# Patient Record
Sex: Female | Born: 1970 | Race: Black or African American | Hispanic: No | Marital: Single | Smoking: Current every day smoker
Health system: Southern US, Community
[De-identification: ages and names within clinical notes are randomized; demographics above are authoritative.]

---

## 1999-02-06 ENCOUNTER — Other Ambulatory Visit: Admission: RE | Admit: 1999-02-06 | Discharge: 1999-02-06 | Payer: Self-pay | Admitting: Obstetrics

## 2000-03-14 ENCOUNTER — Other Ambulatory Visit: Admission: RE | Admit: 2000-03-14 | Discharge: 2000-03-14 | Payer: Self-pay | Admitting: Obstetrics & Gynecology

## 2001-02-13 ENCOUNTER — Emergency Department (HOSPITAL_COMMUNITY): Admission: EM | Admit: 2001-02-13 | Discharge: 2001-02-13 | Payer: Self-pay | Admitting: Emergency Medicine

## 2001-11-19 ENCOUNTER — Other Ambulatory Visit: Admission: RE | Admit: 2001-11-19 | Discharge: 2001-11-19 | Payer: Self-pay | Admitting: Obstetrics and Gynecology

## 2002-05-14 ENCOUNTER — Encounter: Admission: RE | Admit: 2002-05-14 | Discharge: 2002-05-14 | Payer: Self-pay | Admitting: Family Medicine

## 2002-05-21 ENCOUNTER — Encounter: Admission: RE | Admit: 2002-05-21 | Discharge: 2002-05-21 | Payer: Self-pay | Admitting: Sports Medicine

## 2002-05-21 ENCOUNTER — Encounter: Payer: Self-pay | Admitting: Sports Medicine

## 2002-06-12 ENCOUNTER — Encounter: Admission: RE | Admit: 2002-06-12 | Discharge: 2002-06-12 | Payer: Self-pay | Admitting: Family Medicine

## 2002-06-15 ENCOUNTER — Encounter: Admission: RE | Admit: 2002-06-15 | Discharge: 2002-06-15 | Payer: Self-pay | Admitting: Family Medicine

## 2002-07-28 ENCOUNTER — Encounter: Admission: RE | Admit: 2002-07-28 | Discharge: 2002-07-28 | Payer: Self-pay | Admitting: Family Medicine

## 2002-08-11 ENCOUNTER — Encounter: Admission: RE | Admit: 2002-08-11 | Discharge: 2002-08-11 | Payer: Self-pay | Admitting: Family Medicine

## 2003-01-05 ENCOUNTER — Encounter: Admission: RE | Admit: 2003-01-05 | Discharge: 2003-01-05 | Payer: Self-pay | Admitting: Obstetrics and Gynecology

## 2003-01-05 ENCOUNTER — Encounter: Payer: Self-pay | Admitting: Obstetrics and Gynecology

## 2003-03-30 ENCOUNTER — Other Ambulatory Visit: Admission: RE | Admit: 2003-03-30 | Discharge: 2003-03-30 | Payer: Self-pay | Admitting: Obstetrics and Gynecology

## 2003-04-27 ENCOUNTER — Encounter: Admission: RE | Admit: 2003-04-27 | Discharge: 2003-04-27 | Payer: Self-pay | Admitting: Family Medicine

## 2003-04-28 ENCOUNTER — Encounter: Admission: RE | Admit: 2003-04-28 | Discharge: 2003-04-28 | Payer: Self-pay | Admitting: Family Medicine

## 2003-08-03 ENCOUNTER — Encounter: Admission: RE | Admit: 2003-08-03 | Discharge: 2003-08-03 | Payer: Self-pay | Admitting: Family Medicine

## 2003-10-19 ENCOUNTER — Encounter: Admission: RE | Admit: 2003-10-19 | Discharge: 2003-10-19 | Payer: Self-pay | Admitting: Family Medicine

## 2004-04-23 ENCOUNTER — Encounter (INDEPENDENT_AMBULATORY_CARE_PROVIDER_SITE_OTHER): Payer: Self-pay | Admitting: *Deleted

## 2004-04-25 ENCOUNTER — Encounter: Admission: RE | Admit: 2004-04-25 | Discharge: 2004-04-25 | Payer: Self-pay | Admitting: Family Medicine

## 2004-04-25 ENCOUNTER — Encounter: Payer: Self-pay | Admitting: Family Medicine

## 2004-07-25 ENCOUNTER — Encounter: Admission: RE | Admit: 2004-07-25 | Discharge: 2004-07-25 | Payer: Self-pay | Admitting: Sports Medicine

## 2004-10-20 ENCOUNTER — Other Ambulatory Visit: Admission: RE | Admit: 2004-10-20 | Discharge: 2004-10-20 | Payer: Self-pay | Admitting: Obstetrics and Gynecology

## 2005-02-13 ENCOUNTER — Ambulatory Visit: Payer: Self-pay | Admitting: Family Medicine

## 2005-04-05 ENCOUNTER — Observation Stay (HOSPITAL_COMMUNITY): Admission: EM | Admit: 2005-04-05 | Discharge: 2005-04-05 | Payer: Self-pay | Admitting: Emergency Medicine

## 2005-05-02 IMAGING — CR DG ANKLE COMPLETE 3+V*R*
2 series · 2 of 2 positions shown · non-contrast
Comparison: none

CLINICAL DATA: Trimalleolar fracture

RIGHT ANKLE - 2 VIEW

[view not recorded (1 of 2)]
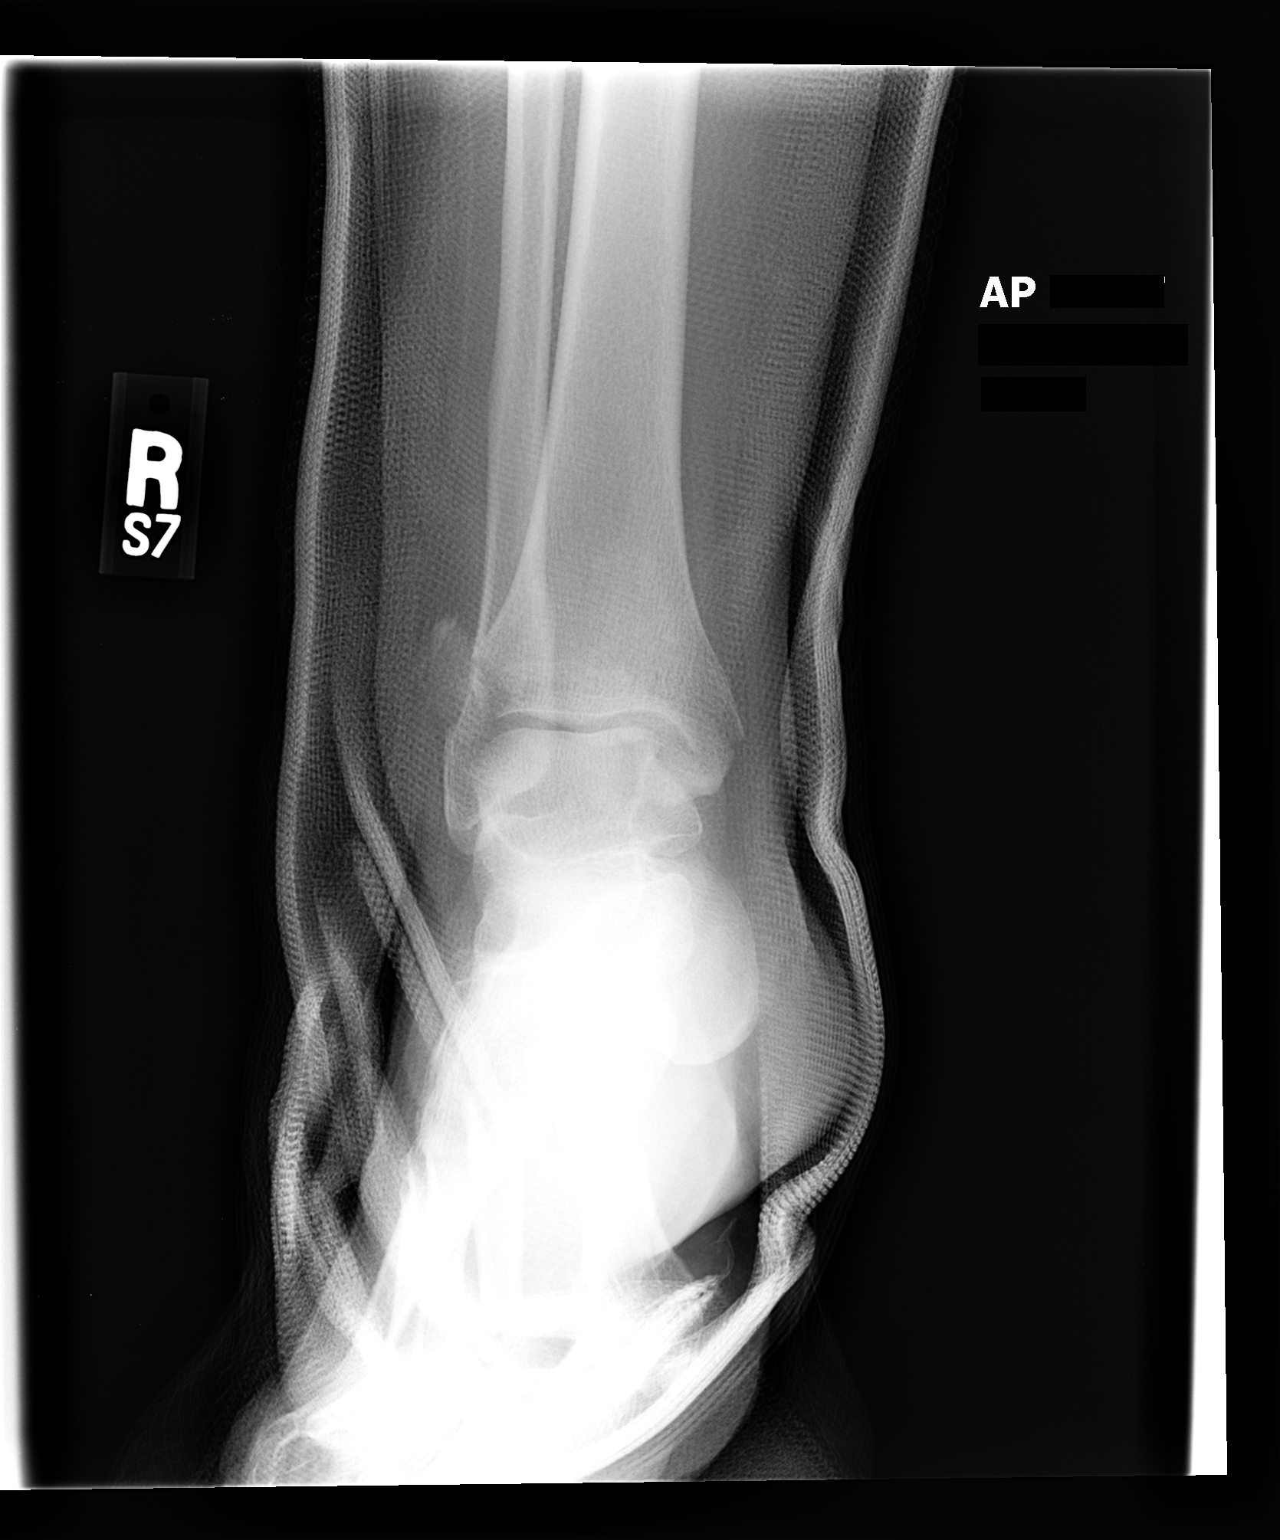

[view not recorded (2 of 2)]
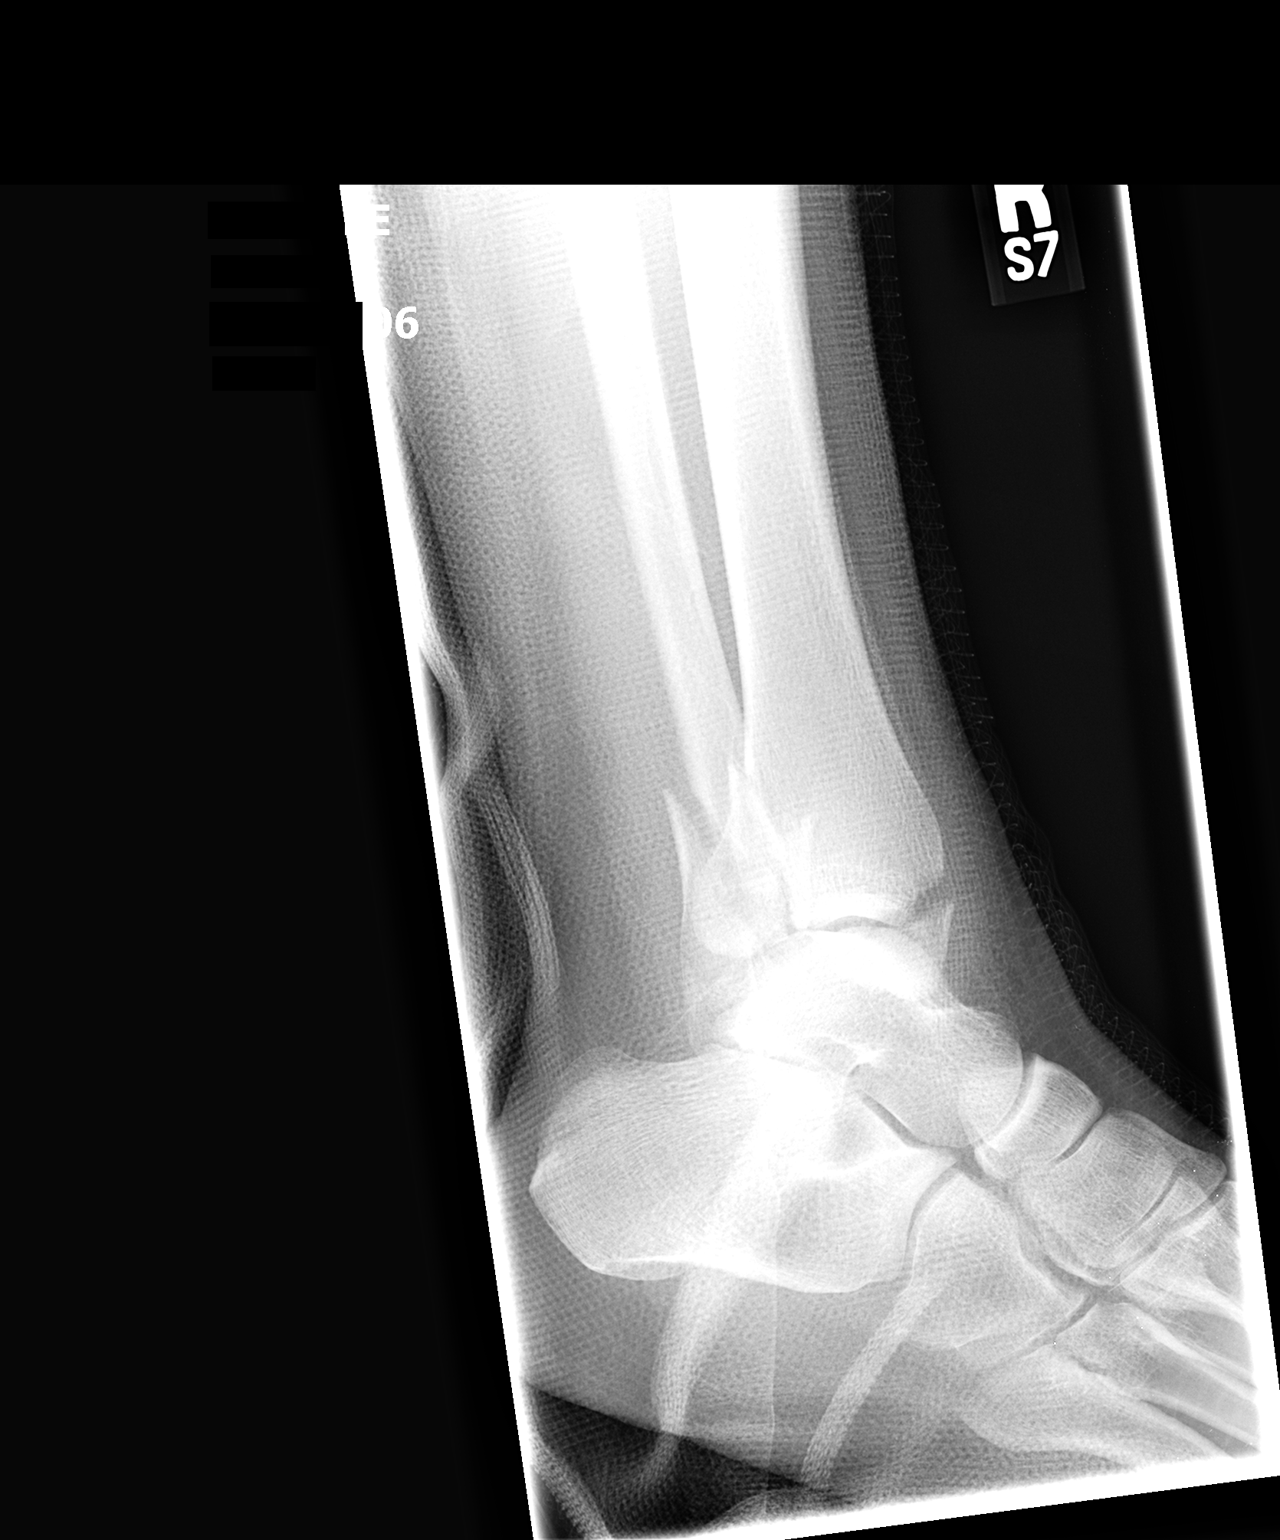

[2 of 2 positions shown; findings below may reference images not displayed]

FINDINGS: In plaster views of the right ankle show that the dislocation has
been reduced. The trimalleolar fracture remains moderately displaced. There is
continued mild widening of the ankle mortise medially.

IMPRESSION

Interval reduction of the right ankle dislocation. The trimalleolar fracture
fragments remain moderately displaced.

## 2005-07-11 ENCOUNTER — Other Ambulatory Visit: Admission: RE | Admit: 2005-07-11 | Discharge: 2005-07-11 | Payer: Self-pay | Admitting: Gynecology

## 2005-09-17 ENCOUNTER — Ambulatory Visit: Payer: Self-pay | Admitting: Family Medicine

## 2005-10-18 ENCOUNTER — Other Ambulatory Visit: Admission: RE | Admit: 2005-10-18 | Discharge: 2005-10-18 | Payer: Self-pay | Admitting: Gynecology

## 2005-12-13 ENCOUNTER — Ambulatory Visit: Payer: Self-pay | Admitting: Family Medicine

## 2006-02-18 ENCOUNTER — Ambulatory Visit: Payer: Self-pay | Admitting: Family Medicine

## 2006-04-22 ENCOUNTER — Ambulatory Visit: Payer: Self-pay | Admitting: Family Medicine

## 2006-09-09 ENCOUNTER — Encounter: Admission: RE | Admit: 2006-09-09 | Discharge: 2006-09-09 | Payer: Self-pay | Admitting: Gastroenterology

## 2006-10-24 ENCOUNTER — Ambulatory Visit: Payer: Self-pay | Admitting: Sports Medicine

## 2007-01-21 ENCOUNTER — Other Ambulatory Visit: Admission: RE | Admit: 2007-01-21 | Discharge: 2007-01-21 | Payer: Self-pay | Admitting: Gynecology

## 2007-02-21 ENCOUNTER — Encounter (INDEPENDENT_AMBULATORY_CARE_PROVIDER_SITE_OTHER): Payer: Self-pay | Admitting: *Deleted

## 2007-03-04 ENCOUNTER — Telehealth: Payer: Self-pay | Admitting: *Deleted

## 2007-03-04 ENCOUNTER — Ambulatory Visit: Payer: Self-pay | Admitting: Family Medicine

## 2007-03-04 ENCOUNTER — Ambulatory Visit (HOSPITAL_COMMUNITY): Admission: RE | Admit: 2007-03-04 | Discharge: 2007-03-04 | Payer: Self-pay | Admitting: Family Medicine

## 2007-03-04 ENCOUNTER — Encounter: Payer: Self-pay | Admitting: Family Medicine

## 2007-03-04 LAB — CONVERTED CEMR LAB
ALT: <8 units/L (ref 0–35)
AST: 9 units/L (ref 0–37)
BUN: 9 mg/dL (ref 6–23)
Bilirubin Urine: NEGATIVE
CO2: 23 meq/L (ref 19–32)
Chloride: 105 meq/L (ref 96–112)
Creatinine, Ser: 1.02 mg/dL (ref 0.40–1.20)
Glucose, Urine, Semiquant: NEGATIVE
Hgb A1c MFr Bld: 5 %
Potassium: 4.1 meq/L (ref 3.5–5.3)
TSH: 1.238 microintl units/mL (ref 0.350–5.50)
Total Bilirubin: 0.6 mg/dL (ref 0.3–1.2)
Urobilinogen, UA: 0.2
Vitamin B-12: 309 pg/mL (ref 211–911)
WBC Urine, dipstick: NEGATIVE

## 2007-03-15 ENCOUNTER — Encounter: Payer: Self-pay | Admitting: Family Medicine

## 2007-03-15 DIAGNOSIS — D582 Other hemoglobinopathies: Secondary | ICD-10-CM | POA: Insufficient documentation

## 2007-03-25 ENCOUNTER — Telehealth: Payer: Self-pay | Admitting: Family Medicine

## 2007-03-25 ENCOUNTER — Ambulatory Visit: Payer: Self-pay | Admitting: Family Medicine

## 2007-03-25 DIAGNOSIS — K219 Gastro-esophageal reflux disease without esophagitis: Secondary | ICD-10-CM | POA: Insufficient documentation

## 2007-03-25 DIAGNOSIS — J309 Allergic rhinitis, unspecified: Secondary | ICD-10-CM | POA: Insufficient documentation

## 2007-03-25 LAB — CONVERTED CEMR LAB: H Pylori IgG: NEGATIVE

## 2007-05-22 ENCOUNTER — Encounter: Payer: Self-pay | Admitting: Family Medicine

## 2007-05-22 ENCOUNTER — Ambulatory Visit: Payer: Self-pay | Admitting: Family Medicine

## 2007-05-22 LAB — CONVERTED CEMR LAB
Glucose, Urine, Semiquant: 500
KOH Prep: NEGATIVE
pH: 6.5

## 2007-12-05 ENCOUNTER — Ambulatory Visit: Payer: Self-pay | Admitting: Family Medicine

## 2007-12-05 ENCOUNTER — Encounter: Payer: Self-pay | Admitting: Family Medicine

## 2007-12-05 LAB — CONVERTED CEMR LAB
Blood Glucose, Fingerstick: 104
Nitrite: NEGATIVE
Urobilinogen, UA: 0.2
pH: 6.5

## 2008-03-26 ENCOUNTER — Ambulatory Visit: Payer: Self-pay | Admitting: Family Medicine

## 2008-03-26 ENCOUNTER — Telehealth (INDEPENDENT_AMBULATORY_CARE_PROVIDER_SITE_OTHER): Payer: Self-pay | Admitting: Family Medicine

## 2008-03-26 DIAGNOSIS — R3129 Other microscopic hematuria: Secondary | ICD-10-CM | POA: Insufficient documentation

## 2008-03-26 LAB — CONVERTED CEMR LAB
Bilirubin Urine: NEGATIVE
Glucose, Urine, Semiquant: NEGATIVE
Nitrite: NEGATIVE
Protein, U semiquant: 30
Specific Gravity, Urine: 1.025
Urobilinogen, UA: 1
pH: 6

## 2008-03-27 ENCOUNTER — Encounter (INDEPENDENT_AMBULATORY_CARE_PROVIDER_SITE_OTHER): Payer: Self-pay | Admitting: Family Medicine

## 2008-08-23 ENCOUNTER — Encounter: Payer: Self-pay | Admitting: Family Medicine

## 2008-08-23 ENCOUNTER — Ambulatory Visit: Payer: Self-pay | Admitting: Family Medicine

## 2008-08-23 ENCOUNTER — Telehealth: Payer: Self-pay | Admitting: *Deleted

## 2008-08-23 LAB — CONVERTED CEMR LAB
BUN: 8 mg/dL (ref 6–23)
Basophils Absolute: 0 10*3/uL (ref 0.0–0.1)
CO2: 23 meq/L (ref 19–32)
Chloride: 105 meq/L (ref 96–112)
Glucose, Bld: 86 mg/dL (ref 70–99)
MCHC: 35.1 g/dL (ref 30.0–36.0)
MCV: 86.1 fL (ref 78.0–100.0)
Monocytes Absolute: 0.7 10*3/uL (ref 0.1–1.0)
Monocytes Relative: 9 % (ref 3–12)
Neutro Abs: 4.3 10*3/uL (ref 1.7–7.7)
Nitrite: NEGATIVE
Platelets: 323 10*3/uL (ref 150–400)
Potassium: 4.3 meq/L (ref 3.5–5.3)
Sodium: 138 meq/L (ref 135–145)
pH: 6

## 2008-08-24 ENCOUNTER — Encounter: Payer: Self-pay | Admitting: Family Medicine

## 2008-08-24 ENCOUNTER — Encounter (INDEPENDENT_AMBULATORY_CARE_PROVIDER_SITE_OTHER): Payer: Self-pay | Admitting: *Deleted

## 2008-08-24 DIAGNOSIS — N200 Calculus of kidney: Secondary | ICD-10-CM | POA: Insufficient documentation

## 2008-08-26 ENCOUNTER — Encounter: Payer: Self-pay | Admitting: Family Medicine

## 2008-09-10 ENCOUNTER — Encounter: Payer: Self-pay | Admitting: Family Medicine

## 2008-10-14 ENCOUNTER — Ambulatory Visit: Payer: Self-pay | Admitting: Family Medicine

## 2008-10-14 ENCOUNTER — Encounter: Payer: Self-pay | Admitting: Family Medicine

## 2008-10-14 LAB — CONVERTED CEMR LAB
Chlamydia, DNA Probe: NEGATIVE
Nitrite: NEGATIVE
Protein, U semiquant: NEGATIVE
Urobilinogen, UA: 0.2
Whiff Test: NEGATIVE

## 2008-10-15 ENCOUNTER — Encounter: Payer: Self-pay | Admitting: Family Medicine

## 2008-12-06 ENCOUNTER — Ambulatory Visit: Payer: Self-pay | Admitting: Family Medicine

## 2008-12-13 ENCOUNTER — Telehealth: Payer: Self-pay | Admitting: Family Medicine

## 2009-06-29 ENCOUNTER — Ambulatory Visit: Payer: Self-pay | Admitting: Family Medicine

## 2009-06-29 ENCOUNTER — Telehealth: Payer: Self-pay | Admitting: Family Medicine

## 2009-06-29 LAB — CONVERTED CEMR LAB
Beta hcg, urine, semiquantitative: NEGATIVE
Bilirubin Urine: NEGATIVE

## 2009-06-30 ENCOUNTER — Encounter: Payer: Self-pay | Admitting: Family Medicine

## 2009-12-26 ENCOUNTER — Encounter (INDEPENDENT_AMBULATORY_CARE_PROVIDER_SITE_OTHER): Payer: Self-pay | Admitting: *Deleted

## 2009-12-26 DIAGNOSIS — F17200 Nicotine dependence, unspecified, uncomplicated: Secondary | ICD-10-CM | POA: Insufficient documentation

## 2010-02-03 ENCOUNTER — Ambulatory Visit: Payer: Self-pay | Admitting: Family Medicine

## 2010-02-19 ENCOUNTER — Emergency Department (HOSPITAL_COMMUNITY): Admission: EM | Admit: 2010-02-19 | Discharge: 2010-02-19 | Payer: Self-pay | Admitting: Family Medicine

## 2010-02-19 ENCOUNTER — Emergency Department (HOSPITAL_COMMUNITY): Admission: EM | Admit: 2010-02-19 | Discharge: 2010-02-19 | Payer: Self-pay | Admitting: Emergency Medicine

## 2010-03-16 ENCOUNTER — Telehealth: Payer: Self-pay | Admitting: Family Medicine

## 2010-03-17 ENCOUNTER — Ambulatory Visit: Payer: Self-pay | Admitting: Family Medicine

## 2010-04-03 ENCOUNTER — Encounter: Admission: RE | Admit: 2010-04-03 | Discharge: 2010-04-03 | Payer: Self-pay | Admitting: Family Medicine

## 2010-04-03 ENCOUNTER — Encounter: Payer: Self-pay | Admitting: Family Medicine

## 2010-04-03 ENCOUNTER — Ambulatory Visit: Payer: Self-pay | Admitting: Family Medicine

## 2010-04-03 LAB — CONVERTED CEMR LAB
Albumin: 4.8 g/dL (ref 3.5–5.2)
HCT: 39.7 % (ref 36.0–46.0)
HDL: 52 mg/dL (ref >39)
MCHC: 35 g/dL (ref 30.0–36.0)
Pap Smear: NEGATIVE
Platelets: 313 10*3/uL (ref 150–400)
Potassium: 4.1 meq/L (ref 3.5–5.3)
RDW: 14 % (ref 11.5–15.5)
Sodium: 137 meq/L (ref 135–145)
Total Bilirubin: 0.4 mg/dL (ref 0.3–1.2)
Total CHOL/HDL Ratio: 3.9
Triglycerides: 152 mg/dL — ABNORMAL HIGH (ref <150)
WBC: 9.1 10*3/uL (ref 4.0–10.5)
Whiff Test: NEGATIVE

## 2010-04-04 ENCOUNTER — Telehealth: Payer: Self-pay | Admitting: Family Medicine

## 2010-04-06 ENCOUNTER — Encounter: Payer: Self-pay | Admitting: Family Medicine

## 2010-04-07 ENCOUNTER — Ambulatory Visit (HOSPITAL_COMMUNITY): Admission: RE | Admit: 2010-04-07 | Discharge: 2010-04-07 | Payer: Self-pay | Admitting: Family Medicine

## 2010-04-21 ENCOUNTER — Telehealth (INDEPENDENT_AMBULATORY_CARE_PROVIDER_SITE_OTHER): Payer: Self-pay | Admitting: *Deleted

## 2010-04-21 ENCOUNTER — Encounter: Payer: Self-pay | Admitting: Family Medicine

## 2010-04-26 ENCOUNTER — Encounter: Payer: Self-pay | Admitting: Family Medicine

## 2010-04-30 IMAGING — CR DG HAND COMPLETE 3+V*R*
3 series · 3 of 3 positions shown · non-contrast
Comparison: None.

CLINICAL DATA: Injured right second finger 3 weeks ago with pain
and swelling

RIGHT HAND - COMPLETE 3+ VIEW

[x hand pa right]
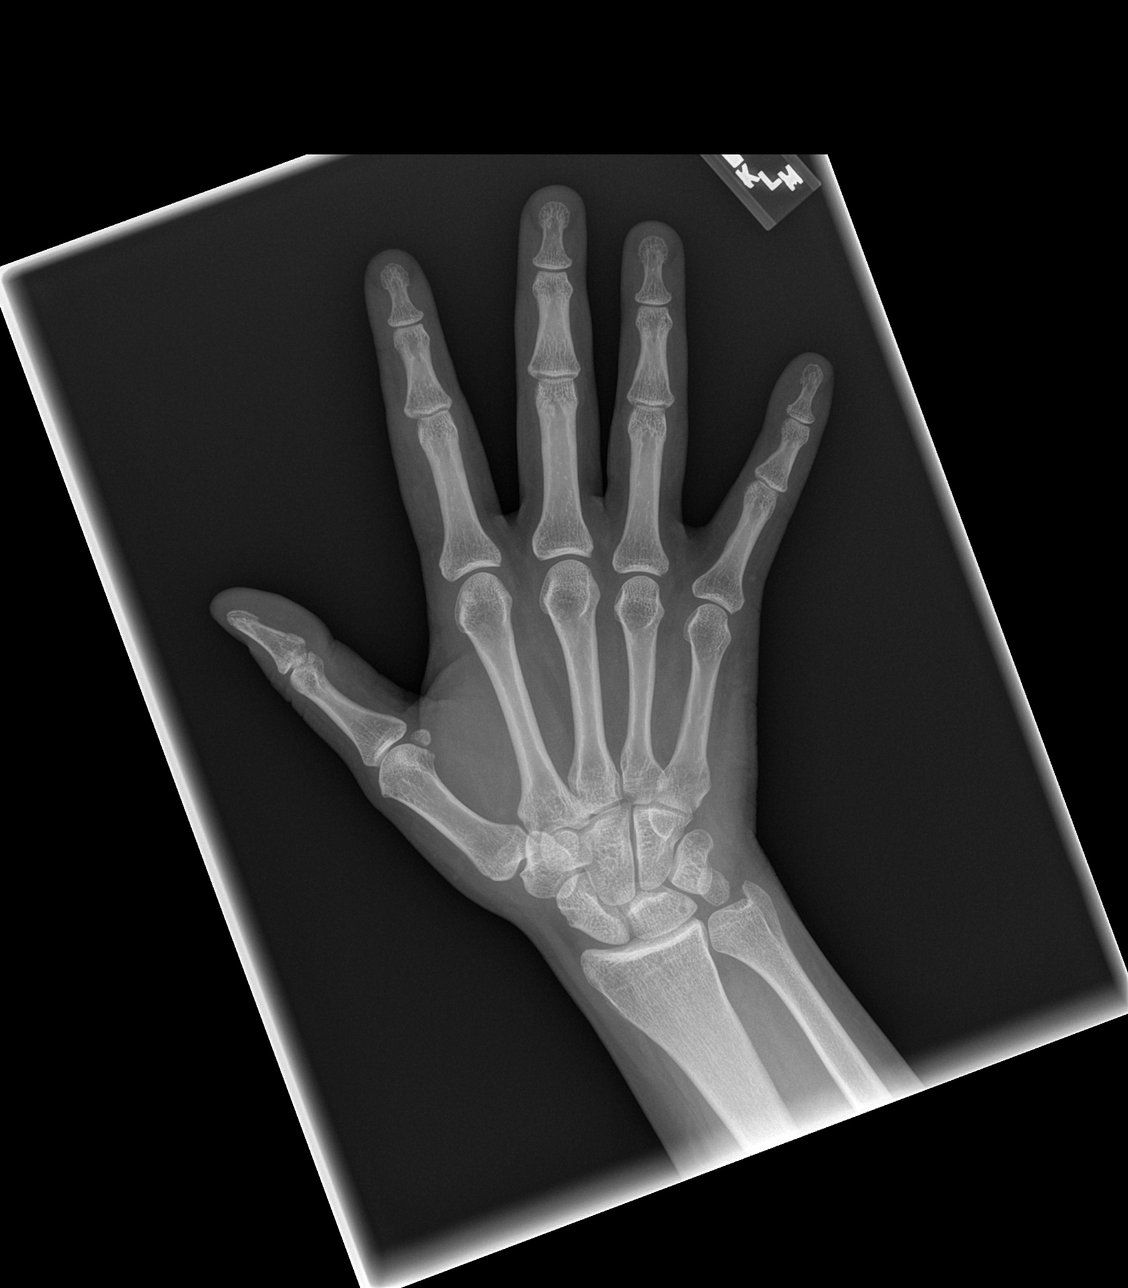

[x hand oblique right]
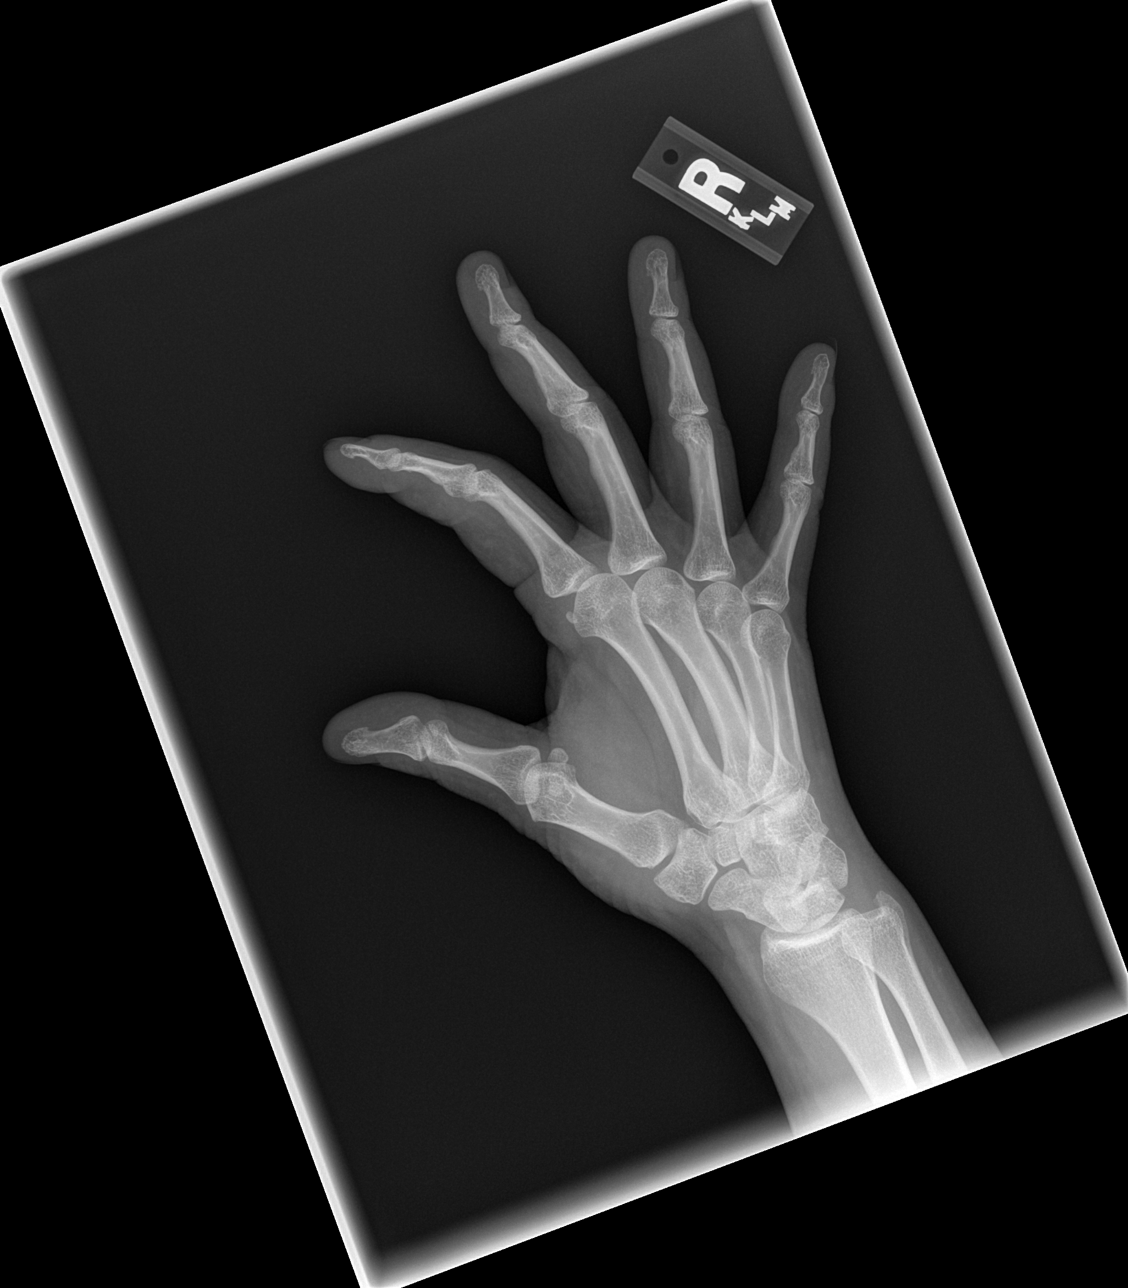

[x hand lat right]
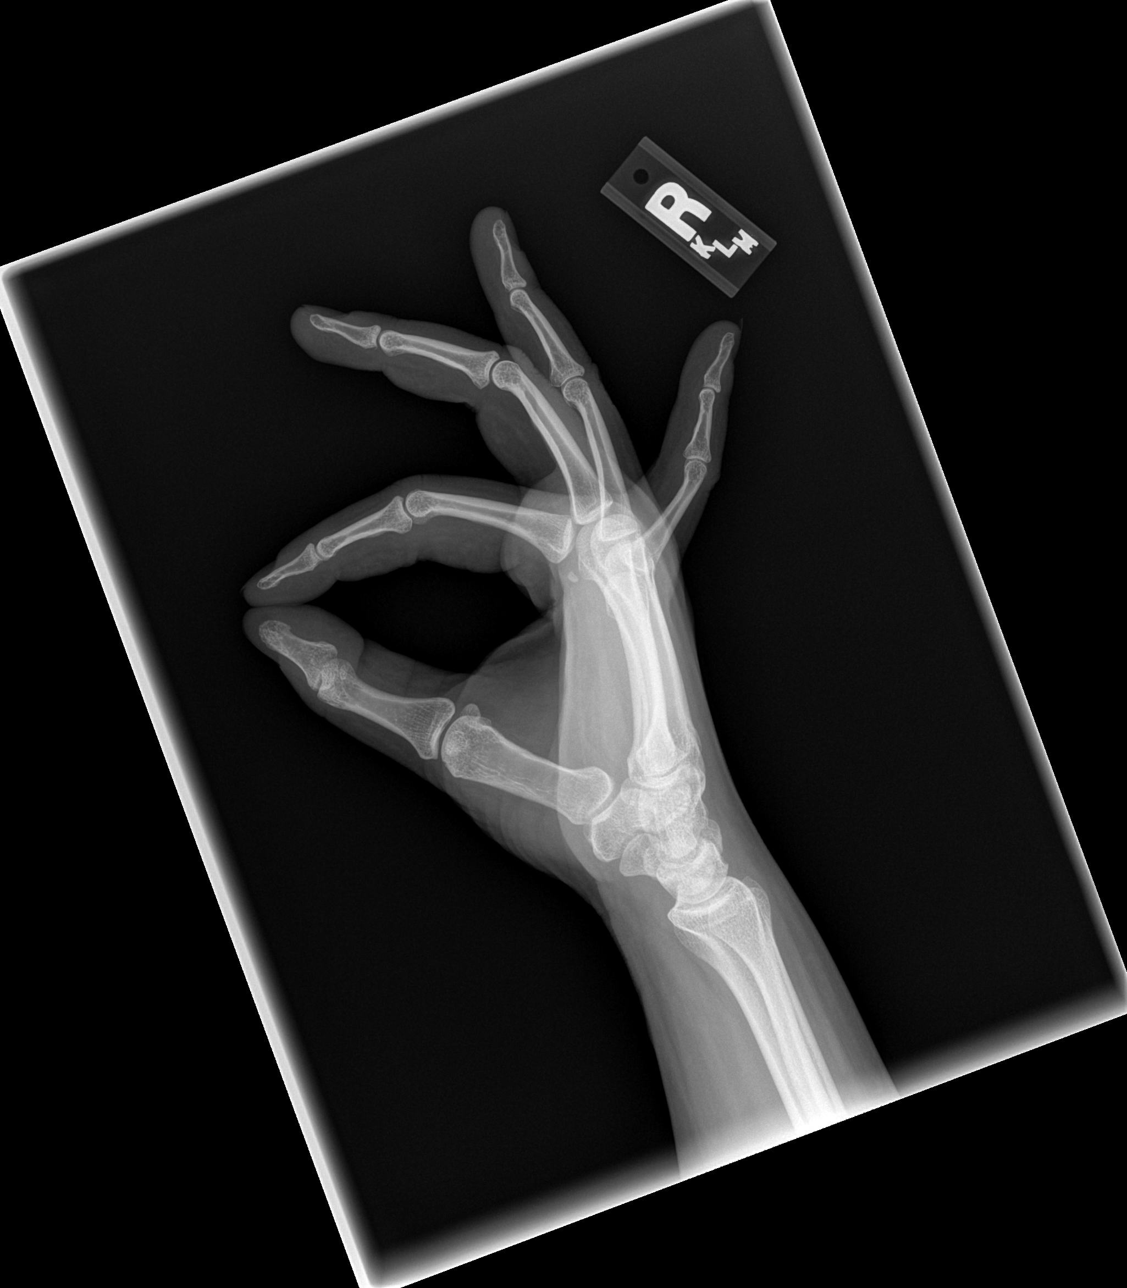

[3 of 3 positions shown; findings below may reference images not displayed]

FINDINGS: The radiocarpal joint space appears normal.  The carpal
bones are in normal position.  MCP, PIP, and DIP joints appear
normal.  No erosion is seen and no fracture is noted.
IMPRESSION: Negative right hand.

## 2010-05-09 ENCOUNTER — Encounter: Payer: Self-pay | Admitting: Family Medicine

## 2010-10-16 ENCOUNTER — Telehealth: Payer: Self-pay | Admitting: Family Medicine

## 2010-10-29 ENCOUNTER — Encounter: Payer: Self-pay | Admitting: Family Medicine

## 2010-11-06 ENCOUNTER — Ambulatory Visit: Payer: Self-pay | Admitting: Family Medicine

## 2010-11-06 DIAGNOSIS — L708 Other acne: Secondary | ICD-10-CM | POA: Insufficient documentation

## 2010-12-12 ENCOUNTER — Encounter: Payer: Self-pay | Admitting: Family Medicine

## 2010-12-12 ENCOUNTER — Ambulatory Visit: Payer: Self-pay | Admitting: Family Medicine

## 2010-12-12 DIAGNOSIS — N912 Amenorrhea, unspecified: Secondary | ICD-10-CM | POA: Insufficient documentation

## 2010-12-12 DIAGNOSIS — N760 Acute vaginitis: Secondary | ICD-10-CM | POA: Insufficient documentation

## 2010-12-12 DIAGNOSIS — R300 Dysuria: Secondary | ICD-10-CM | POA: Insufficient documentation

## 2010-12-12 LAB — CONVERTED CEMR LAB
Chlamydia, DNA Probe: NEGATIVE
GC Probe Amp, Genital: NEGATIVE
Ketones, urine, test strip: NEGATIVE
Nitrite: NEGATIVE
Protein, U semiquant: NEGATIVE
Specific Gravity, Urine: >=1.03
Urobilinogen, UA: 1
WBC Urine, dipstick: NEGATIVE
Whiff Test: POSITIVE

## 2011-01-23 NOTE — Progress Notes (Signed)
Summary: triage   Phone Note Call from Patient Call back at Work Phone [phone]   Summary of Call: Pt feeling bad thinks she might have bronchitis, but her neck is getting to wear she can hardly turn it and it hurts.  Has appt tomorrow, but wondering if maybe she should be seen today. Initial call taken by: Clydell Hakim,  March 16, 2010 2:37 PM  Follow-up for Phone Call        left message Follow-up by: Golden Circle RN,  March 16, 2010 3:05 PM  Additional Follow-up for Phone Call Additional follow up Details #1::        Pt returning Sally's call. Additional Follow-up by: Clydell Hakim,  March 16, 2010 3:11 PM    Additional Follow-up for Phone Call Additional follow up Details #2::    lm Follow-up by: Golden Circle RN,  March 16, 2010 3:21 PM  Additional Follow-up for Phone Call Additional follow up Details #3:: Details for Additional Follow-up Action Taken: neck pain started this am when she was brushing her hair.  can turn neck to  L but if to R it is stiff and painful. denies numbness or tingling. able to move arm & hand normally. suggested she try heat & ibuprofen (with food). told her if the neck muscles are spasming, the heat & ibu will help. if any numbness or tingling occur or loss of motor skills, go to ED. she agreed with plan. changed her appt with [facility] to an earlier one in am.  Additional Follow-up by: Golden Circle RN,  March 16, 2010 3:31 PM  Good advice.

## 2011-01-23 NOTE — Assessment & Plan Note (Signed)
Summary: vag prob,df   Vital Signs:  Patient profile:   40 year old female Weight:      202 pounds Temp:     98.5 degrees F oral Pulse rate:   100 / minute BP sitting:   133 / 84  (right arm)  Vitals Entered By: Renato Battles slade,cma CC: lower pelvic pain x 2 days. pt started menses..bleeding heavy. had smelly vag d/c for few days and thinks she has BV like she had before and metronidizole helped with the problem. Is Patient Diabetic? No Pain Assessment Patient in pain? yes     Location: lower pelvic Intensity: 5 Onset of pain  x 2 days.   Primary Care Provider:  Zachery Dauer MD  CC:  lower pelvic pain x 2 days. pt started menses..bleeding heavy. had smelly vag d/c for few days and thinks she has BV like she had before and metronidizole helped with the problem.Marland Kitchen  History of Present Illness: Here for what she believes is bacterial vaginosis, she has had the symptoms in the past.  She started bleeding heavily today with her menses and refuses to have a wet prep done.  She is asking for one week of antibiotics.  Discussed the down side of treating without a diagnosis.  Since metronidazole oral tabs are back ordered from her pharmacy we went with topical vaginal treatment and education.  Habits & Providers  Alcohol-Tobacco-Diet     Tobacco Status: current     Tobacco Counseling: to quit use of tobacco products  Allergies: No Known Drug Allergies  Review of Systems GU:  Complains of discharge; denies abnormal vaginal bleeding and dysuria.   Impression & Recommendations:  Problem # 1:  TOBACCO USER (ICD-305.1)  Orders: [facility]- Est Level  3 ([address])  Problem # 2:  BACTERIAL VAGINITIS (ICD-616.10)  The following medications were removed from the medication list:    Cephalexin 500 Mg Caps (Cephalexin) .Marland Kitchen... Take one by mouth three times a day x 10 days    Metronidazole 500 Mg Tabs (Metronidazole) ..... One tab two times a day for 7 days Her updated medication list for this problem  includes:    Metrogel-vaginal 0.75 % Gel (Metronidazole) ..... One applicator full at bedtime for 5 nights, qs  Orders: [facility]- Est Level  3 ([address])  Problem # 3:  DYSMENORRHEA (ICD-625.3)  Complete Medication List: 1)  Ibuprofen 800 Mg Tabs (Ibuprofen) .... One tab three times a day as needed for pain 2)  Metrogel-vaginal 0.75 % Gel (Metronidazole) .... One applicator full at bedtime for 5 nights, qs  Patient Instructions: 1)  Ibuprofen use in burts not continuously 2)  BV, next time vaginal prep Prescriptions: METROGEL-VAGINAL 0.75 % GEL (METRONIDAZOLE) one applicator full at bedtime for 5 nights, QS  #1 x 1   Entered and Authorized by:   Luretha Murphy NP   Signed by:   Luretha Murphy NP on 02/03/2010   Method used:   Electronically to        [id number] ([facility])       [address]       [place], [place]  [address]       Ph: [phone] or [phone]       Fax: [phone]   RxID:   [id number] IBUPROFEN 800 MG TABS (IBUPROFEN) one tab three times a day as needed for pain  #50 x 6   Entered and Authorized by:   Luretha Murphy NP   Signed by:   Luretha Murphy NP  on 02/03/2010   Method used:   Electronically to        [id number] (retail)       [address]       [place], [place]  [address]       Ph: [phone] or [phone]       Fax: [phone]   RxID:   [id number] METRONIDAZOLE 500 MG TABS (METRONIDAZOLE) one tab two times a day for 7 days  #14 x 0   Entered and Authorized by:   Luretha Murphy NP   Signed by:   Luretha Murphy NP on 02/03/2010   Method used:   Electronically to        [id number] (retail)       [address]       [place], [place]  [address]       Ph: [phone] or [phone]       Fax: [phone]   RxID:   [id number]    Prevention & Chronic Care Immunizations   Influenza vaccine: Not documented    Tetanus booster: 01/24/2005: Done.    Pneumococcal vaccine: Not documented  Other Screening   Pap smear: Done.  (04/23/2004)    Pap smear action/deferral: GYN referral  (02/03/2010)   Smoking status: current  (02/03/2010)   Smoking cessation counseling: yes  (12/06/2008)  Lipids   Total Cholesterol: Not documented   LDL: Not documented   LDL Direct: Not documented   HDL: Not documented   Triglycerides: Not documented

## 2011-01-23 NOTE — Progress Notes (Signed)
Summary: x-ray results   Phone Note Call from Patient Call back at Work Phone [phone]   Summary of Call: Pt checking on x-ray from yesterday.  Please leave a message on machine if unable to answer. Initial call taken by: Clydell Hakim,  April 04, 2010 9:59 AM  Follow-up for Phone Call        called and left message that no fractur, to begin using her hand and stop splinting Follow-up by: Luretha Murphy NP,  April 04, 2010 10:16 AM

## 2011-01-23 NOTE — Progress Notes (Signed)
Summary: Ref Req   Phone Note Call from Patient Call back at Work Phone [phone]   Caller: Patient Summary of Call: Pt would like to get referral to an orthopedic doctor for her finger. Initial call taken by: Clydell Hakim,  April 21, 2010 3:51 PM  Follow-up for Phone Call        patient reports finger is still swollen and painful . will ask Darl Pikes about referral as requested by patient. Follow-up by: Theresia Lo RN,  April 21, 2010 4:18 PM

## 2011-01-23 NOTE — Progress Notes (Signed)
  Medications Added WELLBUTRIN SR 150 MG XR12H-TAB (BUPROPION HCL) one daily, may increase to two times a day in 1 week       Phone Note Call from Patient   Caller: Patient Call For: [phone] Summary of Call: Pt need refill on Wellbrutin because her last refill was last year.  She is ready to restart her quit smoking program again.  She want to know if she can still have even though original exp 9/10.  If yes,  [facility] [address] Initial call taken by: Abundio Miu,  October 16, 2010 10:49 AM    New/Updated Medications: WELLBUTRIN SR 150 MG XR12H-TAB (BUPROPION HCL) one daily, may increase to two times a day in 1 week Prescriptions: WELLBUTRIN SR 150 MG XR12H-TAB (BUPROPION HCL) one daily, may increase to two times a day in 1 week  #60 x 6   Entered and Authorized by:   Luretha Murphy NP   Signed by:   Luretha Murphy NP on 10/16/2010   Method used:   Electronically to        [id number] ([facility]       [address]       [place], [place]  [address]       Ph: [phone] or [phone]       Fax: [phone]   RxID:   [id number]

## 2011-01-23 NOTE — Assessment & Plan Note (Signed)
Summary: cpe,tcb   Vital Signs:  Patient profile:   40 year old female Height:      68 inches Weight:      203.0 pounds BMI:     30.98 Temp:     98.6 degrees F oral Pulse rate:   97 / minute BP sitting:   124 / 89  (left arm) Cuff size:   regular  Vitals Entered By: Gladstone Pih (April 03, 2010 1:46 PM) CC: CPE and pap Is Patient Diabetic? No Pain Assessment Patient in pain? no        Primary Care Provider:  Zachery Dauer MD  CC:  CPE and pap.  History of Present Illness: Still having pain in her right finger that she injured over 2 weeks ago, she is keeping it taped.   Visited the ER in [place] for left sided abdominal pain and blood in urine,  Abd/Pelvic CT reveled left renal calculi at 5 mm (last at 3 mm in 2009) with hydronephrosis and left ovarian cyst.  She denies hmeaturia or recurrance of the pain.  BV is a problem and used metrogel fo a few days. One sexual patner in the past year.  Still smokes 1/4 ppd.    Habits & Providers  Alcohol-Tobacco-Diet     Tobacco Status: current     Tobacco Counseling: to quit use of tobacco products     Cigarette Packs/Day: 0.25  Current Medications (verified): 1)  Ibuprofen 800 Mg Tabs (Ibuprofen) .... One Tab Three Times A Day As Needed For Pain 2)  Metrogel-Vaginal 0.75 % Gel (Metronidazole) .... One Applicator Full At Bedtime For 5 Nights, Qs  Allergies (verified): No Known Drug Allergies  Social History: Packs/Day:  0.25  Review of Systems      See HPI  Physical Exam  General:  Well-developed,well-nourished,in no acute distress; alert,appropriate and cooperative throughout examination Ears:  External ear exam shows no significant lesions or deformities.  Otoscopic examination reveals clear canals, tympanic membranes are intact bilaterally without bulging, retraction, inflammation or discharge. Hearing is grossly normal bilaterally. Mouth:  Oral mucosa and oropharynx without lesions or exudates.  Teeth in good  repair. Lungs:  Normal respiratory effort, chest expands symmetrically. Lungs are clear to auscultation, no crackles or wheezes. Heart:  Normal rate and regular rhythm. S1 and S2 normal without gallop, murmur, click, rub or other extra sounds. Abdomen:  Bowel sounds positive,abdomen soft and non-tender without masses, organomegaly or hernias noted. Genitalia:  Normal introitus for age, no external lesions, no vaginal discharge, mucosa pink and moist, no vaginal or cervical lesions, no vaginal atrophy, no friaility or hemorrhage, normal uterus size and position, no adnexal masses or tenderness   Impression & Recommendations:  Problem # 1:  HEALTH MAINTENANCE EXAM (ICD-V70.0)  Completed PAP, lipids today  Problem # 2:  RENAL CALCULUS (ICD-592.0) Refer to Dr. Brunilda Payor, patient known to him, CT with hydronephrosis of L kidney and stone larger. Orders: Urology Referral (Urology)  Problem # 3:  OVARIAN CYST, LEFT (ICD-620.2) Recheck transvaginal US, recommended by radiology at time of CT scan.  Problem # 4:  FINGER SPRAIN (ICD-842.10) Persistent pain, Xray normal, patient to begin using her hand, may need PT Orders: Radiology other (Radiology Other)  Complete Medication List: 1)  Ibuprofen 800 Mg Tabs (Ibuprofen) .... One tab three times a day as needed for pain 2)  Metrogel-vaginal 0.75 % Gel (Metronidazole) .... One applicator full at bedtime for 5 nights, qs  Other Orders: Comp Met-[facility] ([address]) Lipid-[facility] ([address]-95621) CBC-[facility] (  [address]) GC/Chlamydia-[facility] (87591/87491) [facility] [phone]) Pap Smear-[facility] ([address]8469-62952) Ultrasound (Ultrasound) [facility] - Est  18-39 yrs ([address])  Patient Instructions: 1)  Please get Xray of you finger today, I will call you with the results 2)  I is important that you follow up wih Dr. Brunilda Payor for your kidney stone as it seems to have increased in size with changes in the size of your kidney 3)  Pelvic ultrasound will be to follow up on the left ovarian cyst,  these sometimes come and go 4)  For your long term health you should: Quit smoking, Walk daily,Eat healthy  Laboratory Results  Date/Time Received: April 03, 2010 2:26 PM  Date/Time Reported: April 03, 2010 2:39 PM   [employer] Source: vaginal WBC/hpf: 1-5 Bacteria/hpf: 3+  Rods Clue cells/hpf: none  Negative whiff Yeast/hpf: none Trichomonas/hpf: none Comments: ...........test performed by...........Marland KitchenTerese Door, CMA

## 2011-01-23 NOTE — Assessment & Plan Note (Signed)
Summary: bronchitis & neck pain/[place]/hale   Vital Signs:  Patient profile:   40 year old female Weight:      202.3 pounds Temp:     99 degrees F oral Pulse rate:   102 / minute BP sitting:   130 / 86  (right arm)  Vitals Entered By: Renato Battles slade,cma CC: cough and congestion x > 2 weeks. body aches and chills. Is Patient Diabetic? No Pain Assessment Patient in pain? no        Primary Care Provider:  Zachery Dauer MD  CC:  cough and congestion x > 2 weeks. body aches and chills..  History of Present Illness: Patient reports cough and congestion that began two weeks ago, cough is minimally productive of small amounts white phlegm, progressively worse associated with chest pain, denies sob or chest pain with exertion, some associated chills but denies fever.  Currently smokes 10 cig/day, no other sick in home, no prior hx of lung disease.  Also reports yesterday noted intermittent tightness in neck with some sharp pain when turning head to left or right.  Pain is worse with movement, and no relieving factors noted. No known injury or repetitve motions. 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  Reports last night while doing laundry she jammed her first finger on right hand, achy throbbing pain that worsens with movement, no relieving factors. Patient has taken Ibuprofen for pain.  Habits & Providers  Alcohol-Tobacco-Diet     Tobacco Status: current     Tobacco Counseling: to quit use of tobacco products  Current Medications (verified): 1)  Ibuprofen 800 Mg Tabs (Ibuprofen) .... One Tab Three Times A Day As Needed For Pain 2)  Metrogel-Vaginal 0.75 % Gel (Metronidazole) .... One Applicator Full At Bedtime For 5 Nights, Qs  Allergies (verified): No Known Drug Allergies  Past History:  Past Medical History: Reviewed history from 10/14/2008 and no changes required. G2P1A1  MRI brain (negative) - 05/25/2002 BMI 27.2 - 10/19/2003 HIV Nonreactive -  07/11/2005 CT - Abdominal L lower kidney stone - 09/09/2006  CT - Pelvic bilat ovarian cysts - 09/09/2006  CT abdomen - bilateral ovarian cysts, left 3mm kidney stone (unchanged since 07).  9/09  Past Surgical History: Reviewed history from 03/15/2007 and no changes required.  excision dysplastic mole R palm - 07/28/2002, Therapeutic abortion - 04/23/2006  Family History: Reviewed history from 08/23/2008 and no changes required. CVA MGGM died age 10  DM - MGM and aunt F died unknown causes Liver disease MGM   GM died renal failure and alcoholism MI - MGGF died age 50, Mom with DM and [place] disease  Social History: Reviewed history from 03/15/2007 and no changes required. Single, son, [place] b 1999  lives with her Occupation: Production designer, theatre/television/film at [employer], Consulting civil engineer Current Smoker: 1/2 PPD  Alcohol use-yes, social  Review of Systems General:  Complains of chills; denies fatigue, fever, loss of appetite, and sweats. Eyes:  Denies discharge and itching. ENT:  Complains of nasal congestion and postnasal drainage; denies difficulty swallowing, ear discharge, sinus pressure, and sore throat. CV:  Denies chest pain or discomfort, difficulty breathing at night, fatigue, lightheadness, palpitations, and shortness of breath with exertion. Resp:  Complains of chest discomfort, cough, and sputum productive; denies chest pain with inspiration and shortness of breath. MS:  Complains of joint pain, joint redness, and joint swelling. Derm:  Denies changes in color of skin and rash. Neuro:  Denies brief paralysis, disturbances in  coordination, numbness, and tingling.  Physical Exam  General:  Well-developed,well-nourished,in no acute distress; alert,appropriate and cooperative throughout examination Head:  Normocephalic and atraumatic without obvious abnormalities. No apparent alopecia or balding. Eyes:  No corneal or conjunctival inflammation noted. EOMI. Perrla. Vision grossly  normal. Ears:  External ear exam shows no significant lesions or deformities.  Otoscopic examination reveals clear canals, tympanic membranes are intact bilaterally without bulging, retraction, inflammation or discharge. Hearing is grossly normal bilaterally. Nose:  External nasal examination shows no deformity or inflammation. Nasal mucosa are pink and moist without lesions or exudates. Mouth:  Oral mucosa and oropharynx without lesions or exudates Neck:  No deformities, masses, or tenderness noted. Chest Wall:  No deformities, masses, or tenderness noted. Lungs:  Normal respiratory effort, chest expands symmetrically. Lungs are clear to auscultation, no crackles or wheezes. Heart:  Normal rate and regular rhythm. S1 and S2 normal without gallop, murmur, click, rub or other extra sounds. Msk:  No deformity or scoliosis noted of thoracic or lumbar spine.  Extremities:  No clubbing, cyanosis, edema, or deformity noted with normal full range of motion of all joints.  Swelling and redness to first right finger at second joint.  Tender with palpation, decreased ROM. Neurologic:  No cranial nerve deficits noted. Station and gait are normal. Plantar reflexes are down-going bilaterally. DTRs are symmetrical throughout. Sensory, motor and coordinative functions appear intact. Skin:  Intact without suspicious lesions or rashes Cervical Nodes:  No lymphadenopathy noted   Impression & Recommendations:  Problem # 1:  BRONCHITIS (ICD-490)  Counseled on viral infections and complications of smoking.  Instructed to use Albuterol and Hydromet for cough. Her updated medication list for this problem includes:    Hydromet 5-1.5 Mg/78ml Syrp (Hydrocodone-homatropine) .Marland Kitchen... 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  Counseled on exercises that would be helpful. Her updated medication list for this problem includes:    Ibuprofen 800 Mg Tabs (Ibuprofen) ..... One tab three times a day as needed for pain    Flexeril 5 Mg Tabs (Cyclobenzaprine hcl) .Marland Kitchen... Take one tab by mouth every 8hr as needed pain  Orders: [facility]- Est  Level 4 ([address])  Problem # 3:  FINGER SPRAIN (ICD-842.10)  Given mechanism of injury, no xray needed at this time, Splint applied for immobilization until swelling decreases and instructed to take Ibuprofen for pain.   Orders: [facility]- Est  Level 4 ([address])  Problem # 4:  TOBACCO USER (ICD-305.1) Counseled on tobacco cessation.  Complete Medication List: 1)  Ibuprofen 800 Mg Tabs (Ibuprofen) .... One tab three times a day as needed for pain 2)  Metrogel-vaginal 0.75 % Gel (Metronidazole) .... One applicator full at bedtime for 5 nights, qs 3)  Hydromet 5-1.5 Mg/5ml Syrp (Hydrocodone-homatropine) .... Take one tsp by mouth every 8hrs as needed cough-139ml 4)  Flexeril 5 Mg Tabs (Cyclobenzaprine hcl) .... Take one tab by mouth every 8hr as needed pain  Patient Instructions: 1)  Stop Smoking 2)  Albuterol and Hydromet for cough as needed 3)  Use Ibuprofen as needed for pain.  4)  Apply heat to area 15-20 min three times daily. 5)  Muscle strengthening exercises at least three times daily. 6)  Splint to right first finger,  keep immobilized. 7)  Return as needed for worsening symptoms. Prescriptions: FLEXERIL 5 MG TABS (CYCLOBENZAPRINE HCL) take one tab by mouth every 8hr as needed pain Brand medically necessary #20 x 0   Entered and Authorized by:   Luretha Murphy NP   Signed by:   Luretha Murphy NP on 03/17/2010   Method used:   Print then Give to Patient   RxID:   [id number] HYDROMET 5-1.5 MG/5ML SYRP (HYDROCODONE-HOMATROPINE) take one tsp by mouth every 8hrs as needed cough-137ml Brand medically necessary #1 x 4   Entered  and Authorized by:   Luretha Murphy NP   Signed by:   Luretha Murphy NP on 03/17/2010   Method used:   Print then Give to Patient   RxID:   [id number]

## 2011-01-23 NOTE — Consult Note (Signed)
Summary: [facility] ? stone still present  [facility]   Imported By: De Nurse 05/12/2010 13:19:51  _____________________________________________________________________  External Attachment:    Type:   Image     Comment:   External Document

## 2011-01-23 NOTE — Consult Note (Signed)
[facility]: [facility]   Imported By: Clydell Hakim 05/03/2010 11:25:21  _____________________________________________________________________  External Attachment:    Type:   Image     Comment:   External Document

## 2011-01-23 NOTE — Assessment & Plan Note (Signed)
Summary: acne/bmc   Vital Signs:  Patient profile:   40 year old female Weight:      210 pounds Pulse rate:   98 / minute BP sitting:   134 / 88  (right arm)  Vitals Entered By: Renato Battles slade,cma CC: acne worse (face) Is Patient Diabetic? No Pain Assessment Patient in pain? no        Primary Care Provider:  Zachery Dauer MD  CC:  acne worse (face).  History of Present Illness: Deep cystic ance, face is full of scars.  She keeps picking at them.  She has used Retin-A in the past and does not like the way it makes her face look.  She would like something to take orally.  Has seen Dr. Nicholas Lose in the past.  Habits & Providers  Alcohol-Tobacco-Diet     Tobacco Status: current     Tobacco Counseling: to quit use of tobacco products  Current Medications (verified): 1)  Ibuprofen 800 Mg Tabs (Ibuprofen) .... One Tab Three Times A Day As Needed For Pain 2)  Wellbutrin Sr 150 Mg Xr12h-Tab (Bupropion Hcl) .... One Daily, May Increase To Two Times A Day in 1 Week 3)  Retin-A 0.01 % Gel (Tretinoin) .... Apply To Face At Bedtime For Severe Acne, 1 Unit  Allergies: No Known Drug Allergies  Physical Exam  General:  Well-developed,well-nourished,in no acute distress; alert,appropriate and cooperative throughout examination Skin:  Heavy make=up over sites of obvioius hard cystic lesions.  Had picture of herself without make up and has many dark scars and cysts.   Impression & Recommendations:  Problem # 1:  CYSTIC ACNE (ICD-706.1) Feel best she see a dermatologist if she is considering Accutane, told her that was not in my scope of practice and reasons for this.  She may need lesions injected and laser reserfacing to get a normal appearence to her skin, apt with Dr. Nicholas Lose this week..  Prescribed retin A gel to use topically. Her updated medication list for this problem includes:    Retin-a 0.01 % Gel (Tretinoin) .Marland Kitchen... Apply to face at bedtime for severe acne, 1  unit  Orders: Dermatology Referral (Derma) [facility]- Est Level  2 (57846)  Complete Medication List: 1)  Ibuprofen 800 Mg Tabs (Ibuprofen) .... One tab three times a day as needed for pain 2)  Wellbutrin Sr 150 Mg Xr12h-tab (Bupropion hcl) .... One daily, may increase to two times a day in 1 week 3)  Retin-a 0.01 % Gel (Tretinoin) .... Apply to face at bedtime for severe acne, 1 unit  Patient Instructions: 1)  Please schedule a follow-up appointment as needed .  Prescriptions: RETIN-A 0.01 % GEL (TRETINOIN) apply to face at bedtime for severe acne, 1 unit  #1 x 1   Entered and Authorized by:   Luretha Murphy NP   Signed by:   Luretha Murphy NP on 11/06/2010   Method used:   Electronically to        [facility] (retail)       [address]       [place], [place]  [address]       Ph: [phone] or [phone]       Fax: [phone]   RxID:   [id number]    Orders Added: 1)  Dermatology Referral [Derma] 2)  [facility]- Est Level  2 [66063]

## 2011-01-23 NOTE — Assessment & Plan Note (Signed)
Summary: Problem update

## 2011-01-23 NOTE — Miscellaneous (Signed)
Summary: Hand Surgeon referral   Clinical Lists Changes  Problems: Added new problem of INJURY, FINGER (ICD-959.5) - Signed Orders: Added new Referral order of Orthopedic Surgeon Referral (Ortho Surgeon) - Signed   appointment has been schedule. see under orders. Theresia Lo RN  April 21, 2010 4:55 PM

## 2011-01-23 NOTE — Miscellaneous (Signed)
Summary: Tobacco Alexa Hess  Clinical Lists Changes  Problems: Added new problem of TOBACCO Alexa Hess (ICD-305.1) 

## 2011-01-23 NOTE — Letter (Signed)
Summary: Generic Letter  [facility]  [address]   [place], [place] [address]   Phone: [phone]  Fax: [phone]    04/06/2010  Alexa Hess [address] [place], [place]  [address]  Dear Alexa Hess,    All of your labs and PAP were normal.  Your LDL or bad cholesterol was 126, we want it under 130.  I would still cut down on animal fat as much as possible.  It was nice to see you, let me know if you need anything else.  If you want therapy on your hand, I will be happy to order.       Sincerely,   Luretha Murphy NP  Appended Document: Generic Letter mailed

## 2011-01-25 ENCOUNTER — Encounter: Payer: Self-pay | Admitting: *Deleted

## 2011-01-25 NOTE — Assessment & Plan Note (Signed)
Summary: yeast infection/eo   Vital Signs:  Patient profile:   40 year old female Height:      68 inches Weight:      208 pounds BMI:     31.74 Temp:     98.9 degrees F oral Pulse rate:   98 / minute BP sitting:   126 / 88  (right arm) Cuff size:   regular  Vitals Entered By: Tessie Fass CMA (December 12, 2010 10:51 AM) Pain Assessment Patient in pain? no        Primary Care Provider:  Zachery Dauer MD   History of Present Illness: New sexual partner, after intercourse noted an odor, and increased discharge.  She self treated for yeast as she has been on doxycycline by Dr. Nicholas Lose for cystic ance.  She also tried using her metrogel.  Today she is here for STD check.  She denies pain.   Current Medications (verified): 1)  Ibuprofen 800 Mg Tabs (Ibuprofen) .... One Tab Three Times A Day As Needed For Pain 2)  Wellbutrin Sr 150 Mg Xr12h-Tab (Bupropion Hcl) .... One Daily, May Increase To Two Times A Day in 1 Week 3)  Retin-A 0.01 % Gel (Tretinoin) .... Apply To Face At Bedtime For Severe Acne, 1 Unit 4)  Metronidazole 500 Mg Tabs (Metronidazole) .... Two Times A Day For 7 Days  Allergies (verified): No Known Drug Allergies  Review of Systems      See HPI GU:  Complains of discharge and dysuria; denies urinary frequency and urinary hesitancy.  Physical Exam  General:  Well-developed,well-nourished,in no acute distress; alert,appropriate and cooperative throughout examination Genitalia:  Normal introitus for age, no external lesions, no vaginal discharge, mucosa pink and moist, no vaginal or cervical lesions, no vaginal atrophy, no friaility or hemorrhage, normal uterus size and position, no adnexal masses or tenderness wet prep + clues, + blood in urine (old)   Impression & Recommendations:  Problem # 1:  BACTERIAL VAGINITIS (ICD-616.10)  Her updated medication list for this problem includes:    Metronidazole 500 Mg Tabs (Metronidazole) .Marland Kitchen..Marland Kitchen Two times a day for 7  days  Orders: [facility]- Est Level  3 ([address]) GC/Chlamydia[facility] (87591/87491) [facility]  Problem # 2:  DYSURIA (ICD-788.1) + hematuria, this has been documented in the past and patient with known renal calculi, has been followed by urology. Her updated medication list for this problem includes:    Metronidazole 500 Mg Tabs (Metronidazole) .Marland Kitchen..Marland Kitchen Two times a day for 7 days  Orders: Urinalysis-[facility] (00000) [facility]- Est Level  3 (09811)  Problem # 3:  AMENORRHEA (ICD-626.0) recommended contraception Orders: U Preg-[facility] (91478)  Complete Medication List: 1)  Ibuprofen 800 Mg Tabs (Ibuprofen) .... One tab three times a day as needed for pain 2)  Wellbutrin Sr 150 Mg Xr12h-tab (Bupropion hcl) .... One daily, may increase to two times a day in 1 week 3)  Retin-a 0.01 % Gel (Tretinoin) .... Apply to face at bedtime for severe acne, 1 unit 4)  Metronidazole 500 Mg Tabs (Metronidazole) .... Two times a day for 7 days  Patient Instructions: 1)  [id number] Prescriptions: METRONIDAZOLE 500 MG TABS (METRONIDAZOLE) two times a day for 7 days  #14 x 0   Entered and Authorized by:   Luretha Murphy NP   Signed by:   Luretha Murphy NP on 12/12/2010   Method used:   Electronically to        [facility]  [address]       [place], [place]  [address]       Ph: [phone] or [phone]       Fax: [phone]   RxID:   [id number]    Orders Added: 1)  Urinalysis-[facility] [00000] 2)  U Preg-[facility] [81025] 3)  [facility]- Est Level  3 [99213] 4)  GC/Chlamydia-[facility] [87591/87491] 5)  Wet Prep- [facility] [42595]    Laboratory Results   Urine Tests  Date/Time Received: December 12, 2010 10:57 AM  Date/Time Reported: December 12, 2010 11:25 AM   Routine Urinalysis   Color: yellow Appearance: Clear Glucose: negative   (Normal Range: Negative) Bilirubin: negative   (Normal Range: Negative) Ketone: negative   (Normal Range: Negative) Spec. Gravity: >=1.030   (Normal Range:  1.003-1.035) Blood: moderate   (Normal Range: Negative) pH: 5.5   (Normal Range: 5.0-8.0) Protein: negative   (Normal Range: Negative) Urobilinogen: 1.0   (Normal Range: 0-1) Nitrite: negative   (Normal Range: Negative) Leukocyte Esterace: negative   (Normal Range: Negative)  Urine Microscopic WBC/HPF: rare RBC/HPF: 1-5 Bacteria/HPF: 2+ Mucous/HPF: 3+ Epithelial/HPF: 5-10 Other: mod clue cells present    Urine HCG: negative Comments: ...............test performed by......Marland KitchenBonnie A. [place], MLS (ASCP)cm  Date/Time Received: December 12, 2010 12:03 PM  Date/Time Reported: December 12, 2010 12:15 PM   Wet [place] Source: vag WBC/hpf: >20 Bacteria/hpf: 3+  Cocci Clue cells/hpf: many  Positive whiff Yeast/hpf: none Trichomonas/hpf: none Comments: ...............test performed by......Marland KitchenBonnie A. [place], MLS (ASCP)cm

## 2011-01-26 ENCOUNTER — Encounter: Payer: Self-pay | Admitting: *Deleted

## 2011-03-14 LAB — URINALYSIS, MICROSCOPIC ONLY
Glucose, UA: 250 mg/dL — AB
Specific Gravity, Urine: 1.024 (ref 1.005–1.030)

## 2011-03-14 LAB — URINE CULTURE: Colony Count: 30000

## 2011-03-14 LAB — POCT URINALYSIS DIP (DEVICE)
Glucose, UA: 100 mg/dL — AB
Ketones, ur: 15 mg/dL — AB

## 2011-03-14 LAB — POCT PREGNANCY, URINE: Preg Test, Ur: NEGATIVE
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
## 2011-10-19 ENCOUNTER — Encounter: Payer: Self-pay | Admitting: Family Medicine

## 2011-10-19 ENCOUNTER — Other Ambulatory Visit (HOSPITAL_COMMUNITY)
Admission: RE | Admit: 2011-10-19 | Discharge: 2011-10-19 | Disposition: A | Discharge status: Home / Self Care | Payer: 59 | Source: Ambulatory Visit | Attending: Family Medicine | Admitting: Family Medicine

## 2011-10-19 ENCOUNTER — Ambulatory Visit (INDEPENDENT_AMBULATORY_CARE_PROVIDER_SITE_OTHER): Payer: Self-pay | Admitting: Family Medicine

## 2011-10-19 VITALS — BP 125/85 | HR 106 | Temp 98.7°F | Ht 68.0 in | Wt 207.0 lb

## 2011-10-19 DIAGNOSIS — R300 Dysuria: Secondary | ICD-10-CM

## 2011-10-19 DIAGNOSIS — Z01419 Encounter for gynecological examination (general) (routine) without abnormal findings: Secondary | ICD-10-CM | POA: Insufficient documentation

## 2011-10-19 DIAGNOSIS — F17200 Nicotine dependence, unspecified, uncomplicated: Secondary | ICD-10-CM

## 2011-10-19 DIAGNOSIS — Z124 Encounter for screening for malignant neoplasm of cervix: Secondary | ICD-10-CM

## 2011-10-19 LAB — POCT UA - MICROSCOPIC ONLY

## 2011-10-19 LAB — POCT URINALYSIS DIPSTICK
Glucose, UA: 100
Ketones, UA: NEGATIVE
Protein, UA: NEGATIVE
pH, UA: 6.5

## 2011-10-19 MED ORDER — IBUPROFEN 800 MG PO TABS: 800.0000 mg | ORAL_TABLET | Freq: Three times a day (TID) | ORAL | Status: DC | PRN

## 2011-10-19 MED ORDER — TRETINOIN 0.01 % EX GEL: Freq: Every day | CUTANEOUS | Status: DC

## 2011-10-19 MED ORDER — BUPROPION HCL ER (SR) 150 MG PO TB12: 150.0000 mg | ORAL_TABLET | Freq: Every day | ORAL | Status: DC

## 2011-10-19 NOTE — Assessment & Plan Note (Addendum)
UA showing no infection, no abx needed.

## 2011-10-19 NOTE — Progress Notes (Signed)
  Subjective:     Alexa Hess is a 40 y.o. female and is here for a comprehensive physical exam. The patient reports problems - concern about UTI (patient says that urine is getting concentrated despite increasing her by mouth fluid intake. Patient also reports a feeling of fullness and increased frequency with urination. No dysuria. No fevers or chills however patient does endorse some left-sided back pain), had cold last week. No cough, SOB, CP, LE edema.   History   Social History  . Marital Status: Single    Spouse Name: N/A    Number of Children: N/A  . Years of Education: N/A   Occupational History  . Not on file.   Social History Main Topics  . Smoking status: Current Everyday Smoker -- 0.5 packs/day    Types: Cigarettes  . Smokeless tobacco: Never Used  . Alcohol Use: Not on file  . Drug Use: Not on file  . Sexually Active: Not on file   Other Topics Concern  . Not on file   Social History Narrative  . No narrative on file   Health Maintenance  Topic Date Due  . Pap Smear  08/07/1990  . Influenza Vaccine  09/24/2011  . Tetanus/tdap  01/24/2015    The following portions of the patient's history were reviewed and updated as appropriate: allergies, current medications, past family history, past medical history, past social history, past surgical history and problem list.  Review of Systems Pertinent items are noted in HPI.   Objective:    BP 125/85  Pulse 106  Temp(Src) 98.7 F (37.1 C) (Oral)  Ht 5\' 8"  (1.727 m)  Wt 207 lb (93.895 kg)  BMI 31.47 kg/m2  LMP 10/05/2011 General appearance: alert, cooperative, appears stated age and no distress Eyes: conjunctivae/corneas clear. PERRL, EOM's intact. Fundi benign. Throat: lips, mucosa, and tongue normal; teeth and gums normal Lungs: clear to auscultation bilaterally and normal percussion bilaterally Heart: regular rate and rhythm, S1, S2 normal, no murmur, click, rub or gallop Abdomen: soft, non-tender;  bowel sounds normal; no masses,  no organomegaly Pelvic: cervix normal in appearance, external genitalia normal, no adnexal masses or tenderness, no cervical motion tenderness, rectovaginal septum normal, uterus normal size, shape, and consistency and vagina normal without discharge Extremities: extremities normal, atraumatic, no cyanosis or edema Pulses: 2+ and symmetric    Assessment:    Healthy female exam. Urine negative.  Counseled on tobacco cessation.      Plan:     See After Visit Summary for Counseling Recommendations

## 2011-10-19 NOTE — Patient Instructions (Addendum)
He was great meeting you today for your Well Woman Exam.  Everything on your exam looks great.  Your blood pressure was normal. We did your Pap smear. We'll send you the results in the next few weeks and the formal letter. I refilled your Motrin for your cramps. It's great that you are thinking about quitting!  One option is the [employer], which puts you in touch with someone who will call and check in to see how you are doing.  The number is 1-800-QUIT NOW.  You could also try over the counter nicotine patches or gum.  Since you smoke 1/2 pack per day, I would recommend the 7mg  patches for you to start with.  I also refilled your Wellbutrin. You urine does not look infected, so I don't think you need any medicines for this!  Please follow up with Dr. Sheffield Slider in a few months to see how stopping smoking is going!

## 2011-10-19 NOTE — Assessment & Plan Note (Signed)
PAP done. No concerns. Counseled on tobacco cessation.

## 2011-10-19 NOTE — Assessment & Plan Note (Signed)
Pt has Rx for wellbutrin which she states is for helping her quit.  Still smoke 1/2 ppd.  States she is interested in quitting. Given information on quit line.

## 2011-10-20 LAB — GC/CHLAMYDIA PROBE AMP, GENITAL: GC Probe Amp, Genital: NEGATIVE

## 2011-10-23 ENCOUNTER — Encounter: Payer: Self-pay | Admitting: Family Medicine

## 2011-11-17 ENCOUNTER — Other Ambulatory Visit: Payer: Self-pay | Admitting: Family Medicine

## 2011-11-18 NOTE — Telephone Encounter (Signed)
Refill request

## 2012-04-24 ENCOUNTER — Ambulatory Visit (INDEPENDENT_AMBULATORY_CARE_PROVIDER_SITE_OTHER): Payer: 59 | Admitting: Family Medicine

## 2012-04-24 ENCOUNTER — Encounter: Payer: Self-pay | Admitting: Family Medicine

## 2012-04-24 VITALS — BP 131/89 | HR 109 | Temp 98.8°F | Ht 68.0 in | Wt 211.0 lb

## 2012-04-24 DIAGNOSIS — N611 Abscess of the breast and nipple: Secondary | ICD-10-CM

## 2012-04-24 DIAGNOSIS — N200 Calculus of kidney: Secondary | ICD-10-CM

## 2012-04-24 DIAGNOSIS — R300 Dysuria: Secondary | ICD-10-CM

## 2012-04-24 DIAGNOSIS — N61 Mastitis without abscess: Secondary | ICD-10-CM

## 2012-04-24 LAB — POCT UA - MICROSCOPIC ONLY

## 2012-04-24 LAB — POCT URINALYSIS DIPSTICK
Glucose, UA: NEGATIVE
Leukocytes, UA: NEGATIVE
Nitrite, UA: NEGATIVE
Urobilinogen, UA: 1

## 2012-04-24 MED ORDER — PHENAZOPYRIDINE HCL 100 MG PO TABS: 100.0000 mg | ORAL_TABLET | Freq: Three times a day (TID) | ORAL | Status: AC | PRN

## 2012-04-24 MED ORDER — CEPHALEXIN 500 MG PO CAPS: 500.0000 mg | ORAL_CAPSULE | Freq: Two times a day (BID) | ORAL | Status: AC

## 2012-04-24 MED ORDER — TAMSULOSIN HCL 0.4 MG PO CAPS: 0.4000 mg | ORAL_CAPSULE | Freq: Every day | ORAL | Status: DC

## 2012-04-24 NOTE — Assessment & Plan Note (Signed)
Urinalysis shows positive blood and a little protein. Shows no signs of infection. Gave patient some Flomax as well as Pyridium to try to help with the spasms and help pass what likely is in a kidney stone. We'll not get any type of imaging at this time. I do not think you would change my management. Patient is going to follow up with primary care provider next week. Patient given red flags and when to seek medical attention. In addition to this patient given Keflex as a wait-and-see antibiotic in case she feels she gets fevers or chills over the course of the weekend when she should continue or start it.

## 2012-04-24 NOTE — Assessment & Plan Note (Signed)
Patient has a resolving abscess at this time likely secondary to patient gaining some weight recently and using a little and keep the area moist. Patient has no fevers or chills seems to be resolving on its own. Patient was given Keflex for the potential of a kidney stone which he can take as well if this seems to be getting infected. Patient can followup in one week's time with primary care provider and probably should be reassessed. It was definitely not large enough to allow for I&D to occur today size is less than 0.3 cm in diameter

## 2012-04-24 NOTE — Progress Notes (Signed)
  Subjective:    Patient ID: Alexa Hess, female    DOB: 1971/11/26, 41 y.o.   MRN: [id number]  HPI 41 year old female with a past medical history significant for microscopic hematuria, renal kidney stones, who presents with dysuria. Patient has had a chronic cramping feeling with urination for approximately one month now. Patient also has a chronic left-sided flank pain she contributes to what she calls an enlarged kidney. Per her notes as well as imaging patient does have mild left hydronephrosis secondary to kidney stones last seen back in 2011. Patient states though for the last week now the pain seems to be getting worse with urination. Patient states cramping mostly is in the bladder region as well as the left back. Patient denies any fevers but states she's had some may be chills recently. Patient denies any gross hematuria any pus in urine if or any constipation or diarrhea. Patient also denies any vaginal discharge.   Patient also has an area on the underside of her left breast she would like looked at. Patient states that it feels like a bump on the skin level mildly tender no discharge no recent history of trauma. Patient has been tried but oil in that area which she doesn't know if it's make it better or not.  Review of Systems As stated in the history of present illness    Objective:   Physical Exam General: No apparent distress Cardiovascular: Regular rate and rhythm Pulmonary: Clear to auscultation bilaterally Abdomen: Bowel sounds positive patient minorly tender in the suprapubic region patient also does have some mild left-sided flank pain but would clarify his negative CVA tenderness. Breast exam: Patient in the 6:00 position on the underside of her breast has a resolving abscess no skin breakdown no erythema minimally tender to palpation.     Assessment & Plan:

## 2012-04-24 NOTE — Patient Instructions (Addendum)
Nice to meet you. Your urine appears to be more associated with a kidney stone then an actual infection. I'm giving you 3 different medications. #1 is called Flomax. This is try to help you pass the stone. This might cause she did feel lightheaded so please be careful when changing from a sitting to standing position. #2 is called Pyridium this will help with the spasms going on in her bladder. He continues is just as needed. #3 is an antibiotic I am going to give you in case she feel like to get fevers or chills over the weekend. I think you should followup with your primary care physician next week.  Kidney Stones Kidney stones (ureteral lithiasis) are deposits that form inside your kidneys. The intense pain is caused by the stone moving through the urinary tract. When the stone moves, the ureter goes into spasm around the stone. The stone is usually passed in the urine.  CAUSES   A disorder that makes certain neck glands produce too much parathyroid hormone (primary hyperparathyroidism).   A buildup of uric acid crystals.   Narrowing (stricture) of the ureter.   A kidney obstruction present at birth (congenital obstruction).   Previous surgery on the kidney or ureters.   Numerous kidney infections.  SYMPTOMS   Feeling sick to your stomach (nauseous).   Throwing up (vomiting).   Blood in the urine (hematuria).   Pain that usually spreads (radiates) to the groin.   Frequency or urgency of urination.  DIAGNOSIS   Taking a history and physical exam.   Blood or urine tests.   Computerized X-ray scan (CT scan).   Occasionally, an examination of the inside of the urinary bladder (cystoscopy) is performed.  TREATMENT   Observation.   Increasing your fluid intake.   Surgery may be needed if you have severe pain or persistent obstruction.  The size, location, and chemical composition are all important variables that will determine the proper choice of action for you. Talk  to your caregiver to better understand your situation so that you will minimize the risk of injury to yourself and your kidney.  HOME CARE INSTRUCTIONS   Drink enough water and fluids to keep your urine clear or pale yellow.   Strain all urine through the provided strainer. Keep all particulate matter and stones for your caregiver to see. The stone causing the pain may be as small as a grain of salt. It is very important to use the strainer each and every time you pass your urine. The collection of your stone will allow your caregiver to analyze it and verify that a stone has actually passed.   Only take over-the-counter or prescription medicines for pain, discomfort, or fever as directed by your caregiver.   Make a follow-up appointment with your caregiver as directed.   Get follow-up X-rays if required. The absence of pain does not always mean that the stone has passed. It may have only stopped moving. If the urine remains completely obstructed, it can cause loss of kidney function or even complete destruction of the kidney. It is your responsibility to make sure X-rays and follow-ups are completed. Ultrasounds of the kidney can show blockages and the status of the kidney. Ultrasounds are not associated with any radiation and can be performed easily in a matter of minutes.  SEEK IMMEDIATE MEDICAL CARE IF:   Pain cannot be controlled with the prescribed medicine.   You have a fever.   The severity or intensity of pain  increases over 18 hours and is not relieved by pain medicine.   You develop a new onset of abdominal pain.   You feel faint or pass out.  MAKE SURE YOU:   Understand these instructions.   Will watch your condition.   Will get help right away if you are not doing well or get worse.  Document Released: 12/10/2005 Document Revised: 11/29/2011 Document Reviewed: 04/07/2010 [facility] Patient Information 2012 [place], [place].

## 2012-04-29 ENCOUNTER — Ambulatory Visit: Payer: 59 | Admitting: Family Medicine

## 2012-08-11 ENCOUNTER — Telehealth: Payer: Self-pay | Admitting: Family Medicine

## 2012-08-11 NOTE — Telephone Encounter (Signed)
Patient reports that she had a normal period which started June 28. Lasted 5-6 days. Generally has a period every 25 days. However on 07/16 had some light bleeding that lasted 2 days . Then on 08/17 again started with light bleeding  that  continues off and on. She is having cramping at times.     Also report in July when had bleeding on 07/16 had a clear to milky discharge from breast.  None since . Has appointment tomorrow for SDA . Advised to call back sooner if cramping is severe.

## 2012-08-11 NOTE — Telephone Encounter (Signed)
Attempted call back but message states voicemail is full. However was able to leave call back number.

## 2012-08-11 NOTE — Telephone Encounter (Signed)
Patient is calling wishing to speak to the nurse about some female problems she is having.  She has made an appt for tomorrow morning.

## 2012-08-12 ENCOUNTER — Ambulatory Visit (INDEPENDENT_AMBULATORY_CARE_PROVIDER_SITE_OTHER): Payer: 59 | Admitting: Family Medicine

## 2012-08-12 ENCOUNTER — Encounter: Payer: Self-pay | Admitting: Family Medicine

## 2012-08-12 ENCOUNTER — Other Ambulatory Visit (HOSPITAL_COMMUNITY)
Admission: RE | Admit: 2012-08-12 | Discharge: 2012-08-12 | Disposition: A | Discharge status: Home / Self Care | Payer: 59 | Source: Ambulatory Visit | Attending: Family Medicine | Admitting: Family Medicine

## 2012-08-12 VITALS — BP 125/86 | HR 102 | Temp 98.1°F | Ht 68.0 in | Wt 212.0 lb

## 2012-08-12 DIAGNOSIS — N926 Irregular menstruation, unspecified: Secondary | ICD-10-CM

## 2012-08-12 DIAGNOSIS — R350 Frequency of micturition: Secondary | ICD-10-CM

## 2012-08-12 DIAGNOSIS — Z113 Encounter for screening for infections with a predominantly sexual mode of transmission: Secondary | ICD-10-CM | POA: Insufficient documentation

## 2012-08-12 DIAGNOSIS — F17200 Nicotine dependence, unspecified, uncomplicated: Secondary | ICD-10-CM

## 2012-08-12 LAB — POCT WET PREP (WET MOUNT): Clue Cells Wet Prep Whiff POC: POSITIVE

## 2012-08-12 LAB — POCT URINALYSIS DIPSTICK
Bilirubin, UA: NEGATIVE
Glucose, UA: 100
Spec Grav, UA: >=1.03
Urobilinogen, UA: 1

## 2012-08-12 LAB — POCT UA - MICROSCOPIC ONLY

## 2012-08-12 NOTE — Progress Notes (Signed)
  Subjective:    Patient ID: Alexa Hess, female    DOB: [birth year], 41 y.o.   MRN: [id number]  HPI # Irregular periods  These are the details from the telephone note 08/19: "Patient reports that she had a normal period which started June 28. Lasted 5-6 days. Generally has a period every 25 days. However on 07/16 had some light bleeding that lasted 2 days . Then again on 08/07 and 08/17 started with light bleeding that continues off and on. She is having cramping at times.  Also report in July when had bleeding on 07/16 had a clear to milky discharge from breast. None since ."  She also would like STD testing  Review of Systems Complaining of fatigue starting July. It has been difficult for her to stay awake at work Denies depression, including significant changes in appetite or sleep. She has difficulty sleeping at baseline. Denies new stressors. Denies dysuria, urinary frequency/urgency Complaining of occasional vaginal irritation  Denies fevers/chills/nausea/vomiting  Allergies, medication, past medical history reviewed.  Tobacco use. 1/2 ppd History of nephrolithiasis    Objective:   Physical Exam GEN: NAD; overweight PSYCH: appears mildly anxious HEENT: no abnormal facial hair CV: RRR, no m/r/g ABD: NABS, soft, NT, ND GU:   Normal vulva and vagina without erythema or tenderness   Scant blood cervical os; thin white vaginal discharge posterior vault    No cervical motion tenderness    Assessment & Plan:

## 2012-08-12 NOTE — Assessment & Plan Note (Signed)
Still smoking 1/2 ppd.

## 2012-08-12 NOTE — Patient Instructions (Addendum)
Follow-up in 1 week to go over your lab results

## 2012-08-12 NOTE — Assessment & Plan Note (Addendum)
Started on July. She had been having very regular 25-day cycles prior, last time in June.  -U preg negative -UA>>>elevated specific gravity and blood but no nitrites or leuk esterase -Will check TSH. She has also been reporting worsening fatigue. No weight changes.  -Will check prolactin due to episode of milky white discharge several weeks ago during onset of these symptoms  -Will check STI per patient request: HIV, RPR, GC/Chlamdyia, wet prep (she had been complaining of occasional vaginal irritation) -Follow-up in 1 week to discuss results and management.

## 2012-08-13 ENCOUNTER — Encounter: Payer: Self-pay | Admitting: Family Medicine

## 2012-08-13 ENCOUNTER — Telehealth: Payer: Self-pay | Admitting: Family Medicine

## 2012-08-13 LAB — SYPHILIS: RPR W/REFLEX TO RPR TITER AND TREPONEMAL ANTIBODIES, TRADITIONAL SCREENING AND DIAGNOSIS ALGORITHM

## 2012-08-13 LAB — PROLACTIN: Prolactin: 7.7 ng/mL

## 2012-08-13 LAB — HIV ANTIBODY (ROUTINE TESTING W REFLEX): HIV: NONREACTIVE

## 2012-08-13 LAB — TSH: TSH: 1.006 u[IU]/mL (ref 0.350–4.500)

## 2012-08-13 MED ORDER — METRONIDAZOLE 500 MG PO TABS: 500.0000 mg | ORAL_TABLET | Freq: Two times a day (BID) | ORAL | Status: AC

## 2012-08-13 NOTE — Telephone Encounter (Signed)
Mailbox full.  Rx sent in for metronidazole.   Will send letter with results.

## 2012-09-16 ENCOUNTER — Telehealth: Payer: Self-pay | Admitting: Family Medicine

## 2012-09-16 NOTE — Telephone Encounter (Signed)
Called pt. Advised to have OV to discuss medications. She declined and said, that she wants Dr.Hale to just send medications to her pharmacy. I explained to her that usually that's not the norm, but I will send her request to Dr.Hale. Lorenda Hatchet, Renato Battles

## 2012-09-16 NOTE — Telephone Encounter (Signed)
Patient is calling because her mom passed away 2 weeks ago and at that time she was told that she may want to take some medication.  She didn't want to but now feels like it may be a good idea.  She uses [facility] on [address]

## 2012-09-17 NOTE — Telephone Encounter (Signed)
Pt is calling back to say that she will just take what she's already using.

## 2013-02-12 ENCOUNTER — Telehealth: Payer: Self-pay | Admitting: Family Medicine

## 2013-02-12 ENCOUNTER — Encounter: Payer: Self-pay | Admitting: Family Medicine

## 2013-02-12 ENCOUNTER — Ambulatory Visit (INDEPENDENT_AMBULATORY_CARE_PROVIDER_SITE_OTHER): Payer: 59 | Admitting: Family Medicine

## 2013-02-12 VITALS — BP 126/87 | HR 109 | Ht 68.0 in | Wt 188.0 lb

## 2013-02-12 DIAGNOSIS — F4323 Adjustment disorder with mixed anxiety and depressed mood: Secondary | ICD-10-CM

## 2013-02-12 MED ORDER — IBUPROFEN 800 MG PO TABS: 800.0000 mg | ORAL_TABLET | Freq: Three times a day (TID) | ORAL | Status: DC | PRN

## 2013-02-12 MED ORDER — SUMATRIPTAN SUCCINATE 25 MG PO TABS: 25.0000 mg | ORAL_TABLET | ORAL | Status: DC | PRN

## 2013-02-12 MED ORDER — BUPROPION HCL 100 MG PO TABS: 100.0000 mg | ORAL_TABLET | Freq: Two times a day (BID) | ORAL | Status: DC

## 2013-02-12 NOTE — Telephone Encounter (Addendum)
Patient called back stating she took a tylenol / sinus tab yesterday due to migraine headache around 4:30 PM . 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  Speech is fine today but she feels "loopy"she states   Consulted Dr.  Leveda Anna and he advised that patient should be seen today. She will check regarding her work today and call back.

## 2013-02-12 NOTE — Patient Instructions (Signed)
Start Wellbutrin  Take Imitrex as needed for migraines  Follow-up in 1-2 weeks

## 2013-02-12 NOTE — Telephone Encounter (Signed)
Patient would like to speak to the nurse about some symptoms she is having.  She has had some headaches and problems with her speech.

## 2013-02-12 NOTE — Telephone Encounter (Signed)
Unable to leave message because mailbox is full.  Dialed 5 to leave message for pager for call back number.Marland Kitchen

## 2013-02-13 DIAGNOSIS — F4323 Adjustment disorder with mixed anxiety and depressed mood: Secondary | ICD-10-CM | POA: Insufficient documentation

## 2013-02-13 NOTE — Assessment & Plan Note (Signed)
She has been under significant stressors and presented momentarily yesterday with incomprehensible/slurred speech while at work.  D/Dx: adjustment disorder/mourning/depression, atypical migraine, TIA (no risk factors) -She has been on Wellbutrin for anxiety and tremors in the past and tolerated this well. We will try again. Consider adding or changing to SSRI if she does not respond well to this.  -Try prn Sumatriptan for migraines.  -Follow-up in 1-2 weeks. Call if any concerns after starting Wellbutrin.  -She declines therapy/grief counseling at this time (she tried latter and said it was not that useful). She feels supported speaking with other family members about how she is doing.

## 2013-02-13 NOTE — Progress Notes (Signed)
  Subjective:    Patient ID: Alexa Hess, female    DOB: January 17, 1971, 42 y.o.   MRN: [id number]  HPI # SDA. She had a momentary episode of slurred, incomprehensible speech yesterday.  She works at [employer] for [employer] 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 She denies this happening before. It lasted for a sentence or so. It has not happened since then.   She has had significant recent stressors. Her mother passed away 4 months ago. 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 Her mother had sickle cell and was admitted for a pain crisis, however, she developed heart failure and died unexpectedly during her hospitalization.   The patient is also having stressors associated with her son.   The patient is tearful today and reports weight loss with decreased appetite and difficulty sleeping.  She lives alone. She feel comforted speaking with family members about how she is doing.   She also has a history of migraines. She gets headaches that are sometimes diffuse or sometimes more focal, behind her right eye. It is associated with photo and phonophobia. She takes OTC analgesics which sometimes helps. It is associated with nausea without vomiting. She has had more headaches recently.   Review of Systems Per HPI Denies loss or decreased consciousness during episode of slurred speech Denies fevers, chills, sick contacts Denies headache at this time Denies seizure-like activity Endorses more jitteriness, shaking in her arms and legs more often recently; she had been on Wellbutrin for this in the past which worked well for her Denies manic episodes however endorses sometimes getting appropriately excited when she has a good idea at work Denies SI/HI  Allergies, medication, past medical history reviewed.  Smoking status noted.  Allergies, medication, past  medical history reviewed.  Smoking status noted.  Tobacco  No known family history of depression or bipolar disorder or stroke    Objective:   Physical Exam GEN: NAD; well-nourished, -appearing CV: RRR, normal S1/S2, no murmur PULM: NI WOB PSYCH: tearful sometimes during interview; shakes her arms and legs; appears anxious and depressed  PHQ-9 12/27 sometime difficult at work, very difficult at home    Assessment & Plan:

## 2013-04-21 ENCOUNTER — Ambulatory Visit (INDEPENDENT_AMBULATORY_CARE_PROVIDER_SITE_OTHER): Payer: 59 | Admitting: Family Medicine

## 2013-04-21 ENCOUNTER — Other Ambulatory Visit (HOSPITAL_COMMUNITY)
Admission: RE | Admit: 2013-04-21 | Discharge: 2013-04-21 | Disposition: A | Discharge status: Home / Self Care | Payer: 59 | Source: Ambulatory Visit | Attending: Family Medicine | Admitting: Family Medicine

## 2013-04-21 VITALS — BP 125/87 | HR 103 | Temp 98.3°F | Wt 191.0 lb

## 2013-04-21 DIAGNOSIS — N912 Amenorrhea, unspecified: Secondary | ICD-10-CM

## 2013-04-21 DIAGNOSIS — Z113 Encounter for screening for infections with a predominantly sexual mode of transmission: Secondary | ICD-10-CM | POA: Insufficient documentation

## 2013-04-21 DIAGNOSIS — R300 Dysuria: Secondary | ICD-10-CM

## 2013-04-21 LAB — POCT UA - MICROSCOPIC ONLY

## 2013-04-21 LAB — POCT URINALYSIS DIPSTICK
Bilirubin, UA: NEGATIVE
Glucose, UA: NEGATIVE
Spec Grav, UA: 1.015

## 2013-04-21 LAB — POCT WET PREP (WET MOUNT)

## 2013-04-21 NOTE — Assessment & Plan Note (Signed)
UA does not show infection. Nothing obvious on pelvic exam causing pain. Does have history of kidney stone with trace blood on UA. Will encourage her to drink plenty of fluids, rest and take ibuprofen as needed for pain. RTC in 2 days if pain worsens or fails to improve. Will send wet prep and GC/Ch as well.

## 2013-04-21 NOTE — Progress Notes (Signed)
Patient ID: Alexa Hess, female   DOB: [birth year], 42 y.o.   MRN: [id number]  [facility] Suezette Lafave M. Vitaliy Eisenhour, MD Phone: [phone]   Subjective: HPI: Patient is a 42 y.o. female presenting to clinic today for same day visit. Concerns today include UTI symptoms  DYSURIA  Onset: 3 weeks of feeling fatigued. Then starting having lower abdominal pain, now radiating to right flank. Lower back pain. Also has vaginal pain Worsening: Yes    Symptoms Urgency: yes  Frequency: yes - 4 times within 30 minutes yesterday Hesitancy: yes  Hematuria: no  Flank Pain: yes  Fever: no, but had chills at night  Nausea/Vomiting: yes, vomited once  Pregnant: Possibly, last period was last week (shorter duration and brownish, passed one clot) STD exposure/history: no Discharge: yes, went away Irritants: no Rash: no  Red Flags  : (Risk Factors for Complicated UTI) Recent Antibiotic Usage (last 30 days): no  Symptoms lasting more than seven (7) days: no  More than 3 UTI's last 12 months: no  PMH of  1. DM: no, usually has glucose in urine 2. Renal Disease/Calculi: yes, this does not feel like it 3. Urinary Tract Abnormality: no  4. Instrumentation/Trauma: no 5. Immunosuppression: no   History Reviewed: Every day smoker. Health Maintenance: UTD  ROS: Please see HPI above.  Objective: Office vital signs reviewed. There were no vitals taken for this visit.  Physical Examination:  General: Awake, alert. NAD HEENT: Atraumatic, normocephalic. MMM.  Neck: No masses palpated. No LAD Pulm: CTAB, no wheezes Cardio: RRR, 2/6 murmur appreciated Abdomen:+BS, soft. TTP RLQ, suprapubic and some LLQ. No rebound. No masses. No guarding GU: External genitalia wnl. No lesions or discharge. Cervix difficult to visualized due to retroverted. Thin white discharge. No CMT. No adnexal tenderness. Extremities: No edema Neuro: Grossly intact  Assessment: 42 y.o. female with  abdominal pain and dysuria  Plan: See Problem List and After Visit Summary

## 2013-04-21 NOTE — Patient Instructions (Signed)
It was nice to meet you today. I will call you tomorrow with your lab results and prescribe medication if you need it.  It sounds like you may have a little kidney stone that could be causing some irritation and blood, but it does not look like you have a bladder infection right now.  Drink LOTS of water to flush everything out. You can take Ibuprofen as needed for pain. Please come back soon to discuss your rash and sinuses.  Jru Pense M. Dareld Mcauliffe, M.D. 04/21/2013 4:46 PM

## 2013-04-22 ENCOUNTER — Ambulatory Visit: Payer: 59 | Admitting: Family Medicine

## 2013-08-14 ENCOUNTER — Ambulatory Visit (INDEPENDENT_AMBULATORY_CARE_PROVIDER_SITE_OTHER): Payer: 59 | Admitting: Family Medicine

## 2013-08-14 ENCOUNTER — Encounter: Payer: Self-pay | Admitting: Family Medicine

## 2013-08-14 VITALS — BP 136/90 | HR 102 | Ht 68.0 in | Wt 191.0 lb

## 2013-08-14 DIAGNOSIS — N912 Amenorrhea, unspecified: Secondary | ICD-10-CM

## 2013-08-14 DIAGNOSIS — N760 Acute vaginitis: Secondary | ICD-10-CM

## 2013-08-14 DIAGNOSIS — F17200 Nicotine dependence, unspecified, uncomplicated: Secondary | ICD-10-CM

## 2013-08-14 DIAGNOSIS — N898 Other specified noninflammatory disorders of vagina: Secondary | ICD-10-CM

## 2013-08-14 DIAGNOSIS — Z01419 Encounter for gynecological examination (general) (routine) without abnormal findings: Secondary | ICD-10-CM

## 2013-08-14 LAB — POCT WET PREP (WET MOUNT)

## 2013-08-14 LAB — POCT URINE PREGNANCY: Preg Test, Ur: NEGATIVE

## 2013-08-14 MED ORDER — NICOTINE 21 MG/24HR TD PT24: 1.0000 | MEDICATED_PATCH | TRANSDERMAL | Status: DC

## 2013-08-14 MED ORDER — METRONIDAZOLE 500 MG PO TABS: 500.0000 mg | ORAL_TABLET | Freq: Two times a day (BID) | ORAL | Status: DC

## 2013-08-14 NOTE — Assessment & Plan Note (Addendum)
42 year old female presents for annual well woman examination and GYN exam. She is up-to-date on immunizations. She is not due for Pap smear for one year. Her GAIL was 0.6% therefore no mammogram will be ordered today. She is too young for colonoscopy. Physical exam was unremarkable except for vaginal discharge which was thought to be bacterial vaginosis. Treatment was provided. She also is late for her cycle however urine pregnancy test was negative today. She was counseled to recheck a home pregnancy test in 1 week if she does not have initiation of menses. Tobacco cessation provided and initiation of taking patches started.

## 2013-08-14 NOTE — Progress Notes (Signed)
Patient ID: Alexa Hess, female   DOB: 1971/07/19, 42 y.o.   MRN: [id number] 42 y.o. year old female presents for well woman/preventative visit and annual GYN examination.  Acute Concerns: vaginal discharge for a few weeks, has not had period for over a month, was due for period approx. August 9th, has had BV in the past, white, thick, occasionally clear, no recent changes in shampoos/conditioners  Diet: does not eat regular meals, drinks two large cups of coffee per day, eats infrequently, 1-2 serving of vegetables per day, 1-2 servings of fruit per day, 1-2 servings of dairy  Exercise: occasional exercise, likes to walk, walks 2 times per week for 20 minutes  Sexual/Birth History: has has regular intercourse with female partner without condoms, [id number]  Birth Control: none currently  Breasts: no recent changes, no nipple drainage, checks breasts normally, no family history of breast cancer  Social:  History   Social History  . Marital Status: Single    Spouse Name: N/A    Number of Children: N/A  . Years of Education: N/A   Social History Main Topics  . Smoking status: Current Every Day Smoker -- 0.50 packs/day for 10 years    Types: Cigarettes  . Smokeless tobacco: Never Used  . Alcohol Use: 0.6 oz/week    1 Glasses of wine per week  . Drug Use: None  . Sexual Activity: None   Other Topics Concern  . None   Social History Narrative  . None    Immunization:  Tdap/TD: up to date  Influenza: up to date  Cancer Screening:  Pap Smear: last in 2012  Mammogram: when younger  Colonoscopy: never  Dexa: never  Physical Exam: VITALS: Reviewed, unremarkable GEN: Pleasant African American female, no acute distress HEENT: Normocephalic, pupils are equal round reactive to light, extraocular movements were intact, no scleral icterus, no conjunctival pallor, nasal septum midline, no rhinorrhea, which mucous membranes, uvula midline, no pharyngeal erythema or exudate, neck  was supple, no anterior or posterior cervical lymphadenopathy, no thyromegaly CARDIAC: Regular rate and rhythm, S1 and S2 present, no murmurs, no heaves or thrills RESP: Clear to auscultation bilaterally, good effort BREAST:Exam performed in the presence of a chaperone.  Fibrocystic breast noted bilaterally without evidence of suspicious lesion or nodule, no nipple discharge noted, no axillary lymphadenopathy noted ABD: Soft, nontender, bowel sounds present, no scars GU/GYN:Exam performed in the presence of a chaperone. Normal external vulvar area, normal urethra, moderate amount of white discharge, wet mount obtained, cervix difficult to visualize do to retroflexed uterus, unable to obtain GC and Chlamydia testing is unable to visualize cervix completely, bimanual examination was unremarkable EXT: 2+ dorsalis pedis and posterior tibialis pulses, no edema SKIN: No rash, no suspicious skin lesions  GAIL score 0.6 %  ASSESSMENT & PLAN: 42 y.o. female presents for annual well woman/preventative exam and GYN exam. Please see problem specific assessment and plan.

## 2013-08-14 NOTE — Assessment & Plan Note (Addendum)
Patient counseled on tobacco cessation. -Will start on nicotine patch 21 mcg daily, patient to return to office in 2 weeks to followup on smoking cessation

## 2013-08-14 NOTE — Assessment & Plan Note (Addendum)
Patient presents with vaginal discharge that is confirmed to be bacterial vaginitis on wet mount -Patient will be treated with seven-day course of metronidazole -Gonorrhea and Chlamydia cultures were unable to be obtained today has cervix could not be fully visualized and patient had just voided. Patient was counseled that if her vaginal discharge is not improved with Flagyl that she can return to office for urine GC/Chlamydia screening.

## 2013-08-14 NOTE — Patient Instructions (Addendum)
Preventive Care for Adults, Female A healthy lifestyle and preventive care can promote health and wellness. Preventive health guidelines for women include the following key practices.  A routine yearly physical is a good way to check with your caregiver about your health and preventive screening. It is a chance to share any concerns and updates on your health, and to receive a thorough exam.  Visit your dentist for a routine exam and preventive care every 6 months. Brush your teeth twice a day and floss once a day. Good oral hygiene prevents tooth decay and gum disease.  The frequency of eye exams is based on your age, health, family medical history, use of contact lenses, and other factors. Follow your caregiver's recommendations for frequency of eye exams.  Eat a healthy diet. Foods like vegetables, fruits, whole grains, low-fat dairy products, and lean protein foods contain the nutrients you need without too many calories. Decrease your intake of foods high in solid fats, added sugars, and salt. Eat the right amount of calories for you.Get information about a proper diet from your caregiver, if necessary.  Regular physical exercise is one of the most important things you can do for your health. Most adults should get at least 150 minutes of moderate-intensity exercise (any activity that increases your heart rate and causes you to sweat) each week. In addition, most adults need muscle-strengthening exercises on 2 or more days a week.  Maintain a healthy weight. The body mass index (BMI) is a screening tool to identify possible weight problems. It provides an estimate of body fat based on height and weight. Your caregiver can help determine your BMI, and can help you achieve or maintain a healthy weight.For adults 20 years and older:  A BMI below 18.5 is considered underweight.  A BMI of 18.5 to 24.9 is normal.  A BMI of 25 to 29.9 is considered overweight.  A BMI of 30 and above is  considered obese.  Maintain normal blood lipids and cholesterol levels by exercising and minimizing your intake of saturated fat. Eat a balanced diet with plenty of fruit and vegetables. Blood tests for lipids and cholesterol should begin at age 20 and be repeated every 5 years. If your lipid or cholesterol levels are high, you are over 50, or you are at high risk for heart disease, you may need your cholesterol levels checked more frequently.Ongoing high lipid and cholesterol levels should be treated with medicines if diet and exercise are not effective.  If you smoke, find out from your caregiver how to quit. If you do not use tobacco, do not start.  If you are pregnant, do not drink alcohol. If you are breastfeeding, be very cautious about drinking alcohol. If you are not pregnant and choose to drink alcohol, do not exceed 1 drink per day. One drink is considered to be 12 ounces (355 mL) of beer, 5 ounces (148 mL) of wine, or 1.5 ounces (44 mL) of liquor.  Avoid use of street drugs. Do not share needles with anyone. Ask for help if you need support or instructions about stopping the use of drugs.  High blood pressure causes heart disease and increases the risk of stroke. Your blood pressure should be checked at least every 1 to 2 years. Ongoing high blood pressure should be treated with medicines if weight loss and exercise are not effective.  If you are 55 to 41 years old, ask your caregiver if you should take aspirin to prevent strokes.  Diabetes   screening involves taking a blood sample to check your fasting blood sugar level. This should be done once every 3 years, after age 45, if you are within normal weight and without risk factors for diabetes. Testing should be considered at a younger age or be carried out more frequently if you are overweight and have at least 1 risk factor for diabetes.  Breast cancer screening is essential preventive care for women. You should practice "breast  self-awareness." This means understanding the normal appearance and feel of your breasts and may include breast self-examination. Any changes detected, no matter how small, should be reported to a caregiver. Women in their 20s and 30s should have a clinical breast exam (CBE) by a caregiver as part of a regular health exam every 1 to 3 years. After age 40, women should have a CBE every year. Starting at age 40, women should consider having a mammography (breast X-ray test) every year. Women who have a family history of breast cancer should talk to their caregiver about genetic screening. Women at a high risk of breast cancer should talk to their caregivers about having magnetic resonance imaging (MRI) and a mammography every year.  The Pap test is a screening test for cervical cancer. A Pap test can show cell changes on the cervix that might become cervical cancer if left untreated. A Pap test is a procedure in which cells are obtained and examined from the lower end of the uterus (cervix).  Women should have a Pap test starting at age 21.  Between ages 21 and 29, Pap tests should be repeated every 2 years.  Beginning at age 30, you should have a Pap test every 3 years as long as the past 3 Pap tests have been normal.  Some women have medical problems that increase the chance of getting cervical cancer. Talk to your caregiver about these problems. It is especially important to talk to your caregiver if a new problem develops soon after your last Pap test. In these cases, your caregiver may recommend more frequent screening and Pap tests.  The above recommendations are the same for women who have or have not gotten the vaccine for human papillomavirus (HPV).  If you had a hysterectomy for a problem that was not cancer or a condition that could lead to cancer, then you no longer need Pap tests. Even if you no longer need a Pap test, a regular exam is a good idea to make sure no other problems are  starting.  If you are between ages 65 and 70, and you have had normal Pap tests going back 10 years, you no longer need Pap tests. Even if you no longer need a Pap test, a regular exam is a good idea to make sure no other problems are starting.  If you have had past treatment for cervical cancer or a condition that could lead to cancer, you need Pap tests and screening for cancer for at least 20 years after your treatment.  If Pap tests have been discontinued, risk factors (such as a new sexual partner) need to be reassessed to determine if screening should be resumed.  The HPV test is an additional test that may be used for cervical cancer screening. The HPV test looks for the virus that can cause the cell changes on the cervix. The cells collected during the Pap test can be tested for HPV. The HPV test could be used to screen women aged 30 years and older, and should   be used in women of any age who have unclear Pap test results. After the age of 30, women should have HPV testing at the same frequency as a Pap test.  Colorectal cancer can be detected and often prevented. Most routine colorectal cancer screening begins at the age of 50 and continues through age 75. However, your caregiver may recommend screening at an earlier age if you have risk factors for colon cancer. On a yearly basis, your caregiver may provide home test kits to check for hidden blood in the stool. Use of a small camera at the end of a tube, to directly examine the colon (sigmoidoscopy or colonoscopy), can detect the earliest forms of colorectal cancer. Talk to your caregiver about this at age 50, when routine screening begins. Direct examination of the colon should be repeated every 5 to 10 years through age 75, unless early forms of pre-cancerous polyps or small growths are found.  Hepatitis C blood testing is recommended for all people born from 1945 through 1965 and any individual with known risks for hepatitis C.  Practice  safe sex. Use condoms and avoid high-risk sexual practices to reduce the spread of sexually transmitted infections (STIs). STIs include gonorrhea, chlamydia, syphilis, trichomonas, herpes, HPV, and human immunodeficiency virus (HIV). Herpes, HIV, and HPV are viral illnesses that have no cure. They can result in disability, cancer, and death. Sexually active women aged 25 and younger should be checked for chlamydia. Older women with new or multiple partners should also be tested for chlamydia. Testing for other STIs is recommended if you are sexually active and at increased risk.  Osteoporosis is a disease in which the bones lose minerals and strength with aging. This can result in serious bone fractures. The risk of osteoporosis can be identified using a bone density scan. Women ages 65 and over and women at risk for fractures or osteoporosis should discuss screening with their caregivers. Ask your caregiver whether you should take a calcium supplement or vitamin D to reduce the rate of osteoporosis.  Menopause can be associated with physical symptoms and risks. Hormone replacement therapy is available to decrease symptoms and risks. You should talk to your caregiver about whether hormone replacement therapy is right for you.  Use sunscreen with sun protection factor (SPF) of 30 or more. Apply sunscreen liberally and repeatedly throughout the day. You should seek shade when your shadow is shorter than you. Protect yourself by wearing long sleeves, pants, a wide-brimmed hat, and sunglasses year round, whenever you are outdoors.  Once a month, do a whole body skin exam, using a mirror to look at the skin on your back. Notify your caregiver of new moles, moles that have irregular borders, moles that are larger than a pencil eraser, or moles that have changed in shape or color.  Stay current with required immunizations.  Influenza. You need a dose every fall (or winter). The composition of the flu vaccine  changes each year, so being vaccinated once is not enough.  Pneumococcal polysaccharide. You need 1 to 2 doses if you smoke cigarettes or if you have certain chronic medical conditions. You need 1 dose at age 65 (or older) if you have never been vaccinated.  Tetanus, diphtheria, pertussis (Tdap, Td). Get 1 dose of Tdap vaccine if you are younger than age 65, are over 65 and have contact with an infant, are a healthcare worker, are pregnant, or simply want to be protected from whooping cough. After that, you need a Td   booster dose every 10 years. Consult your caregiver if you have not had at least 3 tetanus and diphtheria-containing shots sometime in your life or have a deep or dirty wound.  HPV. You need this vaccine if you are a woman age 26 or younger. The vaccine is given in 3 doses over 6 months.  Measles, mumps, rubella (MMR). You need at least 1 dose of MMR if you were born in 1957 or later. You may also need a second dose.  Meningococcal. If you are age 19 to 21 and a first-year college student living in a residence hall, or have one of several medical conditions, you need to get vaccinated against meningococcal disease. You may also need additional booster doses.  Zoster (shingles). If you are age 60 or older, you should get this vaccine.  Varicella (chickenpox). If you have never had chickenpox or you were vaccinated but received only 1 dose, talk to your caregiver to find out if you need this vaccine.  Hepatitis A. You need this vaccine if you have a specific risk factor for hepatitis A virus infection or you simply wish to be protected from this disease. The vaccine is usually given as 2 doses, 6 to 18 months apart.  Hepatitis B. You need this vaccine if you have a specific risk factor for hepatitis B virus infection or you simply wish to be protected from this disease. The vaccine is given in 3 doses, usually over 6 months. Preventive Services / Frequency Ages 19 to 39  Blood  pressure check.** / Every 1 to 2 years.  Lipid and cholesterol check.** / Every 5 years beginning at age 20.  Clinical breast exam.** / Every 3 years for women in their 20s and 30s.  Pap test.** / Every 2 years from ages 21 through 29. Every 3 years starting at age 30 through age 65 or 70 with a history of 3 consecutive normal Pap tests.  HPV screening.** / Every 3 years from ages 30 through ages 65 to 70 with a history of 3 consecutive normal Pap tests.  Hepatitis C blood test.** / For any individual with known risks for hepatitis C.  Skin self-exam. / Monthly.  Influenza immunization.** / Every year.  Pneumococcal polysaccharide immunization.** / 1 to 2 doses if you smoke cigarettes or if you have certain chronic medical conditions.  Tetanus, diphtheria, pertussis (Tdap, Td) immunization. / A one-time dose of Tdap vaccine. After that, you need a Td booster dose every 10 years.  HPV immunization. / 3 doses over 6 months, if you are 26 and younger.  Measles, mumps, rubella (MMR) immunization. / You need at least 1 dose of MMR if you were born in 1957 or later. You may also need a second dose.  Meningococcal immunization. / 1 dose if you are age 19 to 21 and a first-year college student living in a residence hall, or have one of several medical conditions, you need to get vaccinated against meningococcal disease. You may also need additional booster doses.  Varicella immunization.** / Consult your caregiver.  Hepatitis A immunization.** / Consult your caregiver. 2 doses, 6 to 18 months apart.  Hepatitis B immunization.** / Consult your caregiver. 3 doses usually over 6 months. Ages 40 to 64  Blood pressure check.** / Every 1 to 2 years.  Lipid and cholesterol check.** / Every 5 years beginning at age 20.  Clinical breast exam.** / Every year after age 40.  Mammogram.** / Every year beginning at age 40   and continuing for as long as you are in good health. Consult with your  caregiver.  Pap test.** / Every 3 years starting at age 30 through age 65 or 70 with a history of 3 consecutive normal Pap tests.  HPV screening.** / Every 3 years from ages 30 through ages 65 to 70 with a history of 3 consecutive normal Pap tests.  Fecal occult blood test (FOBT) of stool. / Every year beginning at age 50 and continuing until age 75. You may not need to do this test if you get a colonoscopy every 10 years.  Flexible sigmoidoscopy or colonoscopy.** / Every 5 years for a flexible sigmoidoscopy or every 10 years for a colonoscopy beginning at age 50 and continuing until age 75.  Hepatitis C blood test.** / For all people born from 1945 through 1965 and any individual with known risks for hepatitis C.  Skin self-exam. / Monthly.  Influenza immunization.** / Every year.  Pneumococcal polysaccharide immunization.** / 1 to 2 doses if you smoke cigarettes or if you have certain chronic medical conditions.  Tetanus, diphtheria, pertussis (Tdap, Td) immunization.** / A one-time dose of Tdap vaccine. After that, you need a Td booster dose every 10 years.  Measles, mumps, rubella (MMR) immunization. / You need at least 1 dose of MMR if you were born in 1957 or later. You may also need a second dose.  Varicella immunization.** / Consult your caregiver.  Meningococcal immunization.** / Consult your caregiver.  Hepatitis A immunization.** / Consult your caregiver. 2 doses, 6 to 18 months apart.  Hepatitis B immunization.** / Consult your caregiver. 3 doses, usually over 6 months. Ages 65 and over  Blood pressure check.** / Every 1 to 2 years.  Lipid and cholesterol check.** / Every 5 years beginning at age 20.  Clinical breast exam.** / Every year after age 40.  Mammogram.** / Every year beginning at age 40 and continuing for as long as you are in good health. Consult with your caregiver.  Pap test.** / Every 3 years starting at age 30 through age 65 or 70 with a 3  consecutive normal Pap tests. Testing can be stopped between 65 and 70 with 3 consecutive normal Pap tests and no abnormal Pap or HPV tests in the past 10 years.  HPV screening.** / Every 3 years from ages 30 through ages 65 or 70 with a history of 3 consecutive normal Pap tests. Testing can be stopped between 65 and 70 with 3 consecutive normal Pap tests and no abnormal Pap or HPV tests in the past 10 years.  Fecal occult blood test (FOBT) of stool. / Every year beginning at age 50 and continuing until age 75. You may not need to do this test if you get a colonoscopy every 10 years.  Flexible sigmoidoscopy or colonoscopy.** / Every 5 years for a flexible sigmoidoscopy or every 10 years for a colonoscopy beginning at age 50 and continuing until age 75.  Hepatitis C blood test.** / For all people born from 1945 through 1965 and any individual with known risks for hepatitis C.  Osteoporosis screening.** / A one-time screening for women ages 65 and over and women at risk for fractures or osteoporosis.  Skin self-exam. / Monthly.  Influenza immunization.** / Every year.  Pneumococcal polysaccharide immunization.** / 1 dose at age 65 (or older) if you have never been vaccinated.  Tetanus, diphtheria, pertussis (Tdap, Td) immunization. / A one-time dose of Tdap vaccine if you are over   65 and have contact with an infant, are a Research scientist (physical sciences), or simply want to be protected from whooping cough. After that, you need a Td booster dose every 10 years.  Varicella immunization.** / Consult your caregiver.  Meningococcal immunization.** / Consult your caregiver.  Hepatitis A immunization.** / Consult your caregiver. 2 doses, 6 to 18 months apart.  Hepatitis B immunization.** / Check with your caregiver. 3 doses, usually over 6 months. ** Family history and personal history of risk and conditions may change your caregiver's recommendations. Document Released: 02/05/2002 Document Revised: 03/03/2012  Document Reviewed: 05/07/2011 [facility] Patient Information 2014 [place], [place].  Please return to office in 2-3 weeks for followup of smoking cessation. You may check home pregnancy test if you do not have your cycle in one week.

## 2013-08-26 ENCOUNTER — Other Ambulatory Visit: Payer: Self-pay | Admitting: Family Medicine

## 2013-08-26 ENCOUNTER — Encounter: Payer: Self-pay | Admitting: Family Medicine

## 2013-08-26 ENCOUNTER — Ambulatory Visit (INDEPENDENT_AMBULATORY_CARE_PROVIDER_SITE_OTHER): Payer: 59 | Admitting: Family Medicine

## 2013-08-26 VITALS — BP 120/87 | HR 91 | Temp 98.4°F | Ht 68.0 in | Wt 189.0 lb

## 2013-08-26 DIAGNOSIS — R070 Pain in throat: Secondary | ICD-10-CM

## 2013-08-26 DIAGNOSIS — J020 Streptococcal pharyngitis: Secondary | ICD-10-CM | POA: Insufficient documentation

## 2013-08-26 LAB — POCT RAPID STREP A (OFFICE): Rapid Strep A Screen: POSITIVE — AB

## 2013-08-26 MED ORDER — AMOXICILLIN 500 MG PO CAPS: 500.0000 mg | ORAL_CAPSULE | Freq: Two times a day (BID) | ORAL | Status: DC

## 2013-08-26 NOTE — Assessment & Plan Note (Signed)
Positive rapid strep with centaur criteria positive for: absence of cough, tonsilar exudate painful lymphadenopathy.  Will treat with amoxicillin for 10 days.  Check mono spot to rule out mono as reason for myalgias.

## 2013-08-26 NOTE — Progress Notes (Addendum)
Patient ID: Alexa Hess    DOB: 1971/12/11, 42 y.o.   MRN: [id number] --- Subjective:  Alexa Hess is a 42 y.o.female who presents for same day visit with sore throat, white discharge on tonsils, chills since Monday, worst Tuesday. She also has associated headache on right side of face spreading to the ear. She has associated nasal congestion. She has pain in her right neck from what she thinks might be swollen lymph node. She reports myalgias. She denies any cough. No difficulty breathing. She has had chills and felt feverish. She was in close contact with a friend who was sick last Friday with sore throat.   ROS: see HPI Past Medical History: reviewed and updated medications and allergies. Social History: Tobacco: current every day smoker  Objective: Filed Vitals:   08/26/13 1416  BP: 120/87  Pulse: 91  Temp: 98.4 F (36.9 C)  Height: 5\' 8"  (1.727 m)  Weight: 189 lb (85.73 kg)    Physical Examination:   General appearance - alert, tired appearing but non toxic, no acute distress Ears - bilateral TM's and external ear canals normal Nose - congested and erythematous nasal turbinates bilaterally Mouth - mucous membranes moist, swollen and erythematous tonsils bilaterally with white exudates on right tonsil.  Neck - supple, tender right cervical lymph node Chest - clear to auscultation, no wheezes, rales or rhonchi, symmetric air entry Heart - normal rate, regular rhythm, normal S1, S2, no murmurs   Rapid strep: positive

## 2013-08-26 NOTE — Patient Instructions (Addendum)
If you do not get any better or get worst in the next few days, please return to the clinic.  Strep Throat, Group A Streptococcus This is a test to determine if a sore throat (pharyngitis) or tonsil infection (tonsillitis) is caused by a Group A streptococcal bacteria (strep throat).  The test identifies Streptococcus pyogenes, known as Group A streptococcus, which are bacteria (a type of germ) that infect the back of the throat and cause the common infection called strep throat. PREPARATION FOR TEST A swab is brushed against your throat and tonsils. The swab is tested in your doctor's office or may be sent to a laboratory. NORMAL FINDINGS Normal values are negative for strep. Ranges for normal findings may vary among different laboratories and hospitals. You should always check with your doctor after having lab work or other tests done to discuss the meaning of your test results and whether your values are considered within normal limits. MEANING OF TEST  A positive rapid test indicates the presence of group A streptococci, the bacteria that cause strep throat. A negative rapid test indicates that you probably do not have strep throat. If negative, your caregiver may have the sample tested by doing a second test called a culture (a test that grows bacteria taking from the throat). This second test is done to be sure that there is no group A strep in your throat. Culture results may take one or two days. Recent antibiotic therapy or gargling with some mouthwashes before the rapid test may make the test wrong. Your caregiver will go over the test results with you and discuss the importance and meaning of your results, as well as treatment options and the need for additional tests if necessary. OBTAINING THE TEST RESULTS It is your responsibility to obtain your test results. Ask the lab or department performing the test when and how you will get your results. Document Released: 01/12/2005 Document  Revised: 03/03/2012 Document Reviewed: 10/05/2008 [facility] Patient Information 2014 [place], [place].

## 2013-10-27 ENCOUNTER — Ambulatory Visit (INDEPENDENT_AMBULATORY_CARE_PROVIDER_SITE_OTHER): Payer: 59 | Admitting: Family Medicine

## 2013-10-27 ENCOUNTER — Encounter: Payer: Self-pay | Admitting: Family Medicine

## 2013-10-27 VITALS — BP 130/91 | HR 105 | Temp 98.6°F | Ht 68.0 in | Wt 187.0 lb

## 2013-10-27 DIAGNOSIS — J020 Streptococcal pharyngitis: Secondary | ICD-10-CM

## 2013-10-27 DIAGNOSIS — J029 Acute pharyngitis, unspecified: Secondary | ICD-10-CM

## 2013-10-27 LAB — POCT RAPID STREP A (OFFICE): Rapid Strep A Screen: POSITIVE — AB

## 2013-10-27 MED ORDER — AMOXICILLIN-POT CLAVULANATE 875-125 MG PO TABS: 1.0000 | ORAL_TABLET | Freq: Two times a day (BID) | ORAL | Status: DC

## 2013-10-27 NOTE — Assessment & Plan Note (Signed)
Similar symptoms to about 1 month ago. Pt took amoxicillin for full course at that time. Strep swab positive again today. Rx for Augmentin for 10 days and strongly recommended that her SO get re-checked and possibly re-treated, as she and he may be re-infecting each other. Otherwise continue supportive care. Reviewed red flags on when to return to clinic sooner rather than later. Follow up as needed with PCP, Dr. Randolm Idol.

## 2013-10-27 NOTE — Progress Notes (Signed)
  Subjective:    Patient ID: Alexa Hess, female    DOB: [birth year], 42 y.o.   MRN: [id number]  HPI: Pt presents to clinic for SDA for sore throat. Very similar presentation to about 1 month ago. -symptoms present for 1 week - sore throat, pain with swallowing, drainage, did not check temp, no cough -tried OTC medications with no relief (Chlorseptic, Sudafed, Alka-Seltzer Cold-Sinus, etc) -pt's SO was sick 2 months ago and got strep from him 1 month ago -pt took amoxicillin but thinks her SO did not complete his treatment (she felt better between now and then) -appetite has been poor but pt has been able to keep up her fluid intake -denies nausea / vomiting or other systemic symptoms (no rashes)  Review of Systems: As above.     Objective:   Physical Exam BP 130/91  Pulse 105  Temp(Src) 98.6 F (37 C) (Oral)  Ht 5\' 8"  (1.727 m)  Wt 187 lb (84.823 kg)  BMI 28.44 kg/m2  Strep pharnyx swab POSITIVE  Gen: non-toxic appearing adult female in NAD HEENT: [place]/AT, EOMI, MMM, TMs clear  Post oropharynx with 2-3+ tonsils, R > L, with small amount of exudate  Very beefy-red erythema/edema, consistent with strep Cardio: RRR, no murmur Pulm: CTAB, no wheezes Abd: soft, nontender, BS+ Ext: warm, well-perfused, no LE edema     Assessment & Plan:

## 2013-10-27 NOTE — Patient Instructions (Signed)
Thank you for coming in, today!  Your strep test was positive. Take Augmentin for 10 days, similar to before. This is amoxicillin plus another medicine to make it a little stronger. Make sure that you drink plenty of fluids even if you don't feel like eating. Talk to your boyfriend about getting re-checked and possibly re-treated.  If your symptoms get worse instead of better, or if you have NEW symptoms like high fever rash, difficulty breathing, call or come back here, or go to the emergency room. I do not expect that to happen. Follow up with Dr. Randolm Idol, your regular doctor, as needed. Please feel free to call with any questions or concerns at any time, at [phone]. --Dr. Casper Harrison

## 2013-11-27 ENCOUNTER — Ambulatory Visit (INDEPENDENT_AMBULATORY_CARE_PROVIDER_SITE_OTHER): Payer: 59 | Admitting: Family Medicine

## 2013-11-27 VITALS — BP 112/78 | HR 108 | Temp 98.0°F | Ht 68.0 in | Wt 189.0 lb

## 2013-11-27 DIAGNOSIS — F4323 Adjustment disorder with mixed anxiety and depressed mood: Secondary | ICD-10-CM

## 2013-11-27 DIAGNOSIS — F17200 Nicotine dependence, unspecified, uncomplicated: Secondary | ICD-10-CM

## 2013-11-27 MED ORDER — IBUPROFEN 800 MG PO TABS: 800.0000 mg | ORAL_TABLET | Freq: Three times a day (TID) | ORAL | Status: DC | PRN

## 2013-11-27 MED ORDER — CITALOPRAM HYDROBROMIDE 20 MG PO TABS: 10.0000 mg | ORAL_TABLET | Freq: Every day | ORAL | Status: DC

## 2013-11-27 MED ORDER — NICOTINE POLACRILEX 2 MG MT GUM: 2.0000 mg | CHEWING_GUM | OROMUCOSAL | Status: DC | PRN

## 2013-11-27 NOTE — Assessment & Plan Note (Signed)
Patient has depressed mood which has been preset since the death of her mother over a year ago. Patient wishes to attempt trial of pharmacotherapy (previously had improved mood with Wellbutrin however discontinued due to side effects). No SI/HI.  -Will start Celexa 10 mg daily, side effects of the medication discussed with the patient -Patient declines CBT -Return to office in one month.

## 2013-11-27 NOTE — Assessment & Plan Note (Signed)
Patient continues to smoke 1/2 PPD, did not attempt trial of nicotine patches. She would like to try Nicotine Gum -Prescription for Nicotine Gum sent to pharmacy -Return in one month for follow up of tobacco cessation

## 2013-11-27 NOTE — Progress Notes (Signed)
   Subjective:    Patient ID: Alexa Hess, female    DOB: Nov 21, 1971, 42 y.o.   MRN: [id number]  HPI 42 y/o female presents for follow up of multiple medical issues.  Depressed mood - present for over the past year, started after the death of her mother, reports decreased mood, anhedonia, decreased sleep, decreased appetite, no loss of concentration, no SI, no HI, has previously been on Wellbutrin which helped her depressive symptoms however was unable to tolerate, reports pressure sensation in her left arm that radiated to her chest, this sensation resolved after stopping Wellbutrin, currently denies chest pan, no shortness of breath, no recent panic attacks. She exercises very infrequently  Tobacco Dependence - smokes 1/2 PPD, previously had some success with Wellbutrin, did not pick up nicotine patches prescribed at last visit, is interested in nicotine gum at this time, she reports interest in quitting    Review of Systems  Constitutional: Negative for fever, chills and fatigue.  Respiratory: Negative for cough, chest tightness and shortness of breath.   Cardiovascular: Negative for chest pain and palpitations.  Gastrointestinal: Negative for nausea, vomiting, diarrhea and constipation.  Psychiatric/Behavioral: Positive for sleep disturbance. Negative for suicidal ideas, hallucinations, behavioral problems, self-injury and decreased concentration. The patient is nervous/anxious.        Objective:   Physical Exam Vitals: reviewed Cardiac: RRR, S1 and S2 present, no murmurs Resp: CTAB, normal effort Abd: soft, no tenderness, normal bowel sounds Psych: appropriately dressed, mood described as depressed, affect was appropriate, no tangential thought, no delusion, denies HI and SI       Assessment & Plan:  Please see problem specific assessment and plan.

## 2013-11-27 NOTE — Patient Instructions (Signed)
1. Smoking - please start Nicotine Gum 2. Decreased mood - start Celexa 10 daily, Return to office in one month for follow up.

## 2014-01-07 ENCOUNTER — Telehealth: Payer: Self-pay | Admitting: Family Medicine

## 2014-01-07 NOTE — Telephone Encounter (Signed)
Called pt. No answer. No answering machine or voice mail. Please tell pt that she needs OV. We do not call in meds without exam. Thank you. Lorenda Hatchet.Tawonda Legaspi, [name]

## 2014-01-07 NOTE — Telephone Encounter (Signed)
Can he call in some mitronotazole (SP?) for a bacterial infection?

## 2014-01-08 ENCOUNTER — Other Ambulatory Visit (HOSPITAL_COMMUNITY)
Admission: RE | Admit: 2014-01-08 | Discharge: 2014-01-08 | Disposition: A | Discharge status: Home / Self Care | Payer: 59 | Source: Ambulatory Visit | Attending: Family Medicine | Admitting: Family Medicine

## 2014-01-08 ENCOUNTER — Encounter: Payer: Self-pay | Admitting: Family Medicine

## 2014-01-08 ENCOUNTER — Ambulatory Visit (INDEPENDENT_AMBULATORY_CARE_PROVIDER_SITE_OTHER): Payer: 59 | Admitting: Family Medicine

## 2014-01-08 VITALS — BP 125/81 | HR 80 | Ht 68.0 in | Wt 192.0 lb

## 2014-01-08 DIAGNOSIS — R300 Dysuria: Secondary | ICD-10-CM

## 2014-01-08 DIAGNOSIS — N898 Other specified noninflammatory disorders of vagina: Secondary | ICD-10-CM

## 2014-01-08 DIAGNOSIS — Z113 Encounter for screening for infections with a predominantly sexual mode of transmission: Secondary | ICD-10-CM | POA: Insufficient documentation

## 2014-01-08 DIAGNOSIS — N760 Acute vaginitis: Secondary | ICD-10-CM

## 2014-01-08 LAB — POCT URINALYSIS DIPSTICK
BILIRUBIN UA: NEGATIVE
GLUCOSE UA: 100
KETONES UA: NEGATIVE
LEUKOCYTES UA: NEGATIVE
Nitrite, UA: NEGATIVE
Protein, UA: NEGATIVE
Spec Grav, UA: 1.01
Urobilinogen, UA: 0.2
pH, UA: 6

## 2014-01-08 LAB — POCT UA - MICROSCOPIC ONLY

## 2014-01-08 LAB — POCT WET PREP (WET MOUNT): Clue Cells Wet Prep Whiff POC: POSITIVE

## 2014-01-08 MED ORDER — METRONIDAZOLE 500 MG PO TABS: 500.0000 mg | ORAL_TABLET | Freq: Two times a day (BID) | ORAL | Status: DC

## 2014-01-08 MED ORDER — FLUCONAZOLE 150 MG PO TABS: 150.0000 mg | ORAL_TABLET | Freq: Once | ORAL | Status: DC

## 2014-01-08 NOTE — Telephone Encounter (Signed)
Pt has appt today. Alexa Hess.[name], [name]

## 2014-01-08 NOTE — Patient Instructions (Addendum)
Dear Oralia RudKimberly Clemenson, Thank you for coming in to clinic today. It was good to meet you!  Today we discussed your Vaginal Discharge. 1. Your urine test was normal, and did not show signs of infection. Given your persistent symptoms, I have also requested that it be sent for culture, you will be notified if this is positive requiring other antibiotics. If you don't hear back, that means that this is negative. 2. We performed a pelvic exam with a wet prep today, and it showed BV, and I have already sent a new prescription for Metronidazole 500mg  take twice daily for 7 days, do not drink alcohol with this med (it will make you sick and nauseas) 3. I have also sent a rx for Diflucan, in case you develop a yeast infection from this antibiotic. Please take 1 tablet, and if it is not resolved you may refill it and take a second tablet in 48-72 hours. 3. I would recommend future repeat HIV / RPR testing in future.  Please schedule a follow-up appointment with Dr. Randolm IdolFletke in 1 week if your symptoms are not improving.  If you have any other questions or concerns, please feel free to call the clinic to contact me. You may also schedule an earlier appointment if necessary.  However, if your symptoms get significantly worse, please go to the Emergency Department to seek immediate medical attention.  Saralyn PilarAlexander Correen Bubolz, DO [facility]

## 2014-01-09 LAB — URINE CULTURE
Colony Count: NO GROWTH
ORGANISM ID, BACTERIA: NO GROWTH

## 2014-01-09 NOTE — Assessment & Plan Note (Addendum)
Wet prep confirms significant BV (+clues, +whiff), no yeast, no trich. Clinically consistent with presentation. Questionable symptoms of UTI, with negative UA (only +1 bacteria) - Pelvic exam normal except for inc discharge, no concerns for PID  Plan: 1. Metronidazole 500mg  BID x 7 days. (Pt initially asked for Metrogel, given significant BV, encouraged to try PO first). Requested refills at this time, deferred to PCP 2. sent rx Diflucan 150mg  PO x 1 (1 refill), given h/o frequent yeast infections s/p abx 3. Attempted GC/CT cultures from best approx of cervix. If negative would still recommend repeating at future date, however low clinical suspicion at this time. 4. Ordered Urine Culture (pending), call pt if positive 5. Interested in repeat HIV/RPR in future when returns for pap smear 6. Counseled on BV and advise for prevention, primarily encouraged regular condom use 7. RTC if not improved x 1 week

## 2014-01-09 NOTE — Progress Notes (Signed)
Subjective:     Patient ID: Alexa SilversmithKimberly M Skorupski, female   DOB: 05-26-1971, 43 y.o.   MRN: [id number]  Patient presents for a same day visit.  HPI  VAGINAL DISCHARGE / VAGINITIS: Reports 1 month history of increased vaginal discharge, described as thicker white to yellowish color, with increased "strong odor", associated with some burning / itching. Admits to multiple prior BV, yeast, infections in past, and notes that this feels similar. - Sexual Hx: 1 partner for >6967yr, previously stopped having sex for period of time and did not have any problems, current episode related to resuming sex, intermittent condom use, denies spermicide use - Treatment: tried OTC medicine for possible yeast infection, recent antibiotics Amoxicillin < 3 mo ago   LMP: 01/02/14, regular cycle  Social Hx: Current smoker  Review of Systems  See above HPI, additionally:  Admits to occasional chills (has not taken temperature), intermittent lower abdominal pain, increased urinary frequency, inc vaginal discharge, itching/burning. Denies HA, rash, swollen lymph nodes, nausea / vomiting, diarrhea, dysuria, hematuria.     Objective:   Physical Exam  BP 125/81  Pulse 80  Ht 5\' 8"  (1.727 m)  Wt 192 lb (87.091 kg)  BMI 29.20 kg/m2  LMP 01/02/2014  Gen - pleasant, well-appearing, NAD HEENT - oropharynx clear without lesions, MMM Neck - supple, non-tender, no LAD Heart - RRR, no murmurs heard Lungs - CTAB, no wheezing, crackles, or rhonchi. Normal work of breathing. Abd - soft, mildly tender suprapubic region, no masses, +active BS Ext - non-tender, no edema, peripheral pulses intact +2 b/l Skin - warm, dry, no rashes Neuro - awake, alert, oriented, grossly non-focal GU: normal external female genitalia, no enlarged/tender lymph nodes, normal Bartholin's glands, no evidence of genital warts Pelvic Exam: normal appearing vagina without lesions or bleeding, increased thick white discharge, difficult to visualize  cervix suspected retroflexed, without friability. No cervical motion tenderness, no adnexal enlargement or tenderness     Assessment:     See specific A&P problem list for details.      Plan:     See specific A&P problem list for details.

## 2014-04-06 ENCOUNTER — Encounter: Payer: Self-pay | Admitting: Emergency Medicine

## 2014-04-06 ENCOUNTER — Other Ambulatory Visit (HOSPITAL_COMMUNITY)
Admission: RE | Admit: 2014-04-06 | Discharge: 2014-04-06 | Disposition: A | Discharge status: Home / Self Care | Payer: 59 | Source: Ambulatory Visit | Attending: Emergency Medicine | Admitting: Emergency Medicine

## 2014-04-06 ENCOUNTER — Ambulatory Visit (INDEPENDENT_AMBULATORY_CARE_PROVIDER_SITE_OTHER): Payer: 59 | Admitting: Emergency Medicine

## 2014-04-06 VITALS — BP 121/91 | HR 97 | Temp 98.2°F | Ht 68.0 in | Wt 187.0 lb

## 2014-04-06 DIAGNOSIS — N926 Irregular menstruation, unspecified: Secondary | ICD-10-CM

## 2014-04-06 DIAGNOSIS — Z113 Encounter for screening for infections with a predominantly sexual mode of transmission: Secondary | ICD-10-CM | POA: Insufficient documentation

## 2014-04-06 DIAGNOSIS — N898 Other specified noninflammatory disorders of vagina: Secondary | ICD-10-CM | POA: Insufficient documentation

## 2014-04-06 LAB — POCT WET PREP (WET MOUNT): Clue Cells Wet Prep Whiff POC: POSITIVE

## 2014-04-06 MED ORDER — METRONIDAZOLE 500 MG PO TABS: 500.0000 mg | ORAL_TABLET | Freq: Two times a day (BID) | ORAL | Status: DC

## 2014-04-06 MED ORDER — FLUCONAZOLE 150 MG PO TABS: 150.0000 mg | ORAL_TABLET | Freq: Once | ORAL | Status: DC

## 2014-04-06 NOTE — Progress Notes (Signed)
   Subjective:    Patient ID: Alexa Hess [place], female    DOB: [birth year], 43 y.o.   MRN: [id number]  HPI Alexa Hess [place] is here for vaginal discharge and irregular periods.  Vaginal discharge She reports several days of foul smelling vaginal discharge.  Some mild vaginal itching.  States has had several BV infections in the last year and this is the same.  Some mild pelvic aching.  No new sexual partners.  Has noticed that if she has sex without condoms, she will get these infections.  Irregular periods Reports regular menses through January.  Skipped her cycle in February, then had 2 in March.  LMP 03/12/14.  No period yet this month.  States mom went through menopause around age 42.  Current Outpatient Prescriptions on File Prior to Visit  Medication Sig Dispense Refill  . amoxicillin-clavulanate (AUGMENTIN) 875-125 MG per tablet Take 1 tablet by mouth 2 (two) times daily.  20 tablet  0  . citalopram (CELEXA) 20 MG tablet Take 0.5 tablets (10 mg total) by mouth daily.  30 tablet  1  . fluconazole (DIFLUCAN) 150 MG tablet Take 1 tablet (150 mg total) by mouth once.  1 tablet  1  . ibuprofen (ADVIL,MOTRIN) 800 MG tablet Take 1 tablet (800 mg total) by mouth every 8 (eight) hours as needed.  60 tablet  1  . metroNIDAZOLE (FLAGYL) 500 MG tablet Take 1 tablet (500 mg total) by mouth 2 (two) times daily. Do not drink alcohol while taking this medicine.  14 tablet  0  . nicotine polacrilex (NICORELIEF) 2 MG gum Take 1 each (2 mg total) by mouth as needed for smoking cessation.  100 tablet  0   No current facility-administered medications on file prior to visit.    I have reviewed and updated the following as appropriate: allergies, current medications and problem list SHx: current smoker  Health Maintenance: pap due in 09/2014.   Review of Systems See HPI    Objective:   Physical Exam BP 121/91  Pulse 97  Temp(Src) 98.2 F (36.8 C) (Oral)  Ht 5\' 8"  (1.727 Hess)  Wt 187 lb  (84.823 kg)  BMI 28.44 kg/m2  LMP 03/12/2014 Gen: alert, cooperative, NAD Pelvic: normal external genitalia; normal vagina; moderate amount thin yellowish discharge; no CMT     Assessment & Plan:

## 2014-04-06 NOTE — Assessment & Plan Note (Signed)
Will check TSH and FSH. If cycles remain irregular, would consider ultrasound vs endometrial biopsy. F/u if not return to normal in 3 months.

## 2014-04-06 NOTE — Patient Instructions (Signed)
It was nice to meet you!  You have the bacterial infection again. Take flagyl 1 pill twice a day for 7 days.  Some one will call you the end of the week with your other results.  Follow up in 6 months for a pap smear.

## 2014-04-06 NOTE — Assessment & Plan Note (Addendum)
Wet prep shows BV. Treat with flagyl.  Prescription for diflucan given to use after antibiotics. Will call with results of GC/Chlamydia.  Discussed etiology of BV and ways to prevent. F/u prn.

## 2014-04-07 LAB — FOLLICLE STIMULATING HORMONE: FSH: 9.1 m[IU]/mL

## 2014-04-07 LAB — TSH: TSH: 0.614 u[IU]/mL (ref 0.350–4.500)

## 2014-04-08 LAB — CERVICOVAGINAL ANCILLARY ONLY
Chlamydia: NEGATIVE
Neisseria Gonorrhea: NEGATIVE

## 2014-04-09 ENCOUNTER — Telehealth: Payer: Self-pay | Admitting: *Deleted

## 2014-04-09 NOTE — Telephone Encounter (Signed)
Message copied by Arlyss RepressSLADE, Neoma Uhrich on Fri Apr 09, 2014  2:14 PM ------      Message from: Charm RingsHONIG, ERIN J      Created: Fri Apr 09, 2014  1:33 PM       Could someone call her and let her know her GC/Chlamydia was negative.  Also her FSH and TSH were normal.  If her periods do not go back to normal in the next 2 months, she should come back for additional evaluation.            Thanks!      Denny PeonErin      ----- Message -----         From: Jennette Billobert L Busick, CMA         Sent: 04/06/2014   3:24 PM           To: Charm RingsErin J Honig, MD                   ------

## 2014-04-09 NOTE — Telephone Encounter (Signed)
Called pt. Informed. Pt reports, that she started her period yesterday. Arlyss Repress.Mackenzy Eisenberg

## 2014-04-12 ENCOUNTER — Telehealth: Payer: Self-pay | Admitting: *Deleted

## 2014-04-12 NOTE — Telephone Encounter (Signed)
Attempted to call patient, mailbox is full. Will try to call patient again later.Alexa Cheadleobert L Elanda Garmany

## 2014-04-12 NOTE — Telephone Encounter (Signed)
Message copied by Jennette BillBUSICK, ROBERT L on Mon Apr 12, 2014  4:51 PM ------      Message from: Charm RingsHONIG, ERIN J      Created: Fri Apr 09, 2014  1:33 PM       Could someone call her and let her know her GC/Chlamydia was negative.  Also her FSH and TSH were normal.  If her periods do not go back to normal in the next 2 months, she should come back for additional evaluation.            Thanks!      Denny PeonErin      ----- Message -----         From: Jennette Billobert L Busick, CMA         Sent: 04/06/2014   3:24 PM           To: Charm RingsErin J Honig, MD                   ------

## 2014-04-13 NOTE — Telephone Encounter (Signed)
Attempted call again.  Mailbox full and not accepting messages.  Radene OuKristen L Tamilyn Lupien, CMA

## 2014-08-23 ENCOUNTER — Ambulatory Visit (INDEPENDENT_AMBULATORY_CARE_PROVIDER_SITE_OTHER): Payer: 59 | Admitting: Family Medicine

## 2014-08-23 ENCOUNTER — Encounter: Payer: Self-pay | Admitting: Family Medicine

## 2014-08-23 VITALS — BP 118/88 | HR 92 | Temp 98.3°F | Ht 68.0 in | Wt 195.0 lb

## 2014-08-23 DIAGNOSIS — N200 Calculus of kidney: Secondary | ICD-10-CM

## 2014-08-23 DIAGNOSIS — M545 Low back pain, unspecified: Secondary | ICD-10-CM

## 2014-08-23 LAB — POCT UA - MICROSCOPIC ONLY

## 2014-08-23 LAB — POCT URINALYSIS DIPSTICK
BILIRUBIN UA: NEGATIVE
GLUCOSE UA: NEGATIVE
KETONES UA: NEGATIVE
Leukocytes, UA: NEGATIVE
Nitrite, UA: NEGATIVE
PROTEIN UA: NEGATIVE
SPEC GRAV UA: 1.01
Urobilinogen, UA: 1
pH, UA: 5.5

## 2014-08-23 MED ORDER — CEPHALEXIN 500 MG PO CAPS: 500.0000 mg | ORAL_CAPSULE | Freq: Four times a day (QID) | ORAL | Status: DC

## 2014-08-23 NOTE — Patient Instructions (Addendum)
For pain management: Over the Counter pain medications at this time: you can try ibuprofen, aleve, tylenol; continue to take whatever one works best for you.  We will test the urine for culture of infection. If this comes back positive I will call you and instruct you to fill the keflex. I would not start this until you receive a phone call from me.

## 2014-08-23 NOTE — Assessment & Plan Note (Signed)
Unclear etiology of current pain, leading suspicion is for recurrent renal stone given colicky nature of sharp pain. Patient very concerned about infection and going to the ED. Discuss imaging options (KUB, ultrasound, CT) but patient states she would be unable to afford copays on any of these. UA was unremarkable except for small blood. Had extensive discussion that treating her with antibiotics currently without known source of infection is not indicated. Additional ddx would include uterine/ovarian pathology, patient with recurrent vaginitis/BV over the past year but states she is not having any discharge currently and declines bimanual exam, low suspicion as well for GI pathology given no diarrhea/minimal constipation/no blood or other changes in stool. Plan: conservative management at this point, printed rx for keflex and will call patient with urine culture results (which I expect to be negative), instructed not to fill the keflex until that time. Patient declined additional treatment for possible stone (flomax). Red flags and return precautions discussed. I would have a low threshold for ordering CT if patient symptoms worsen.

## 2014-08-23 NOTE — Progress Notes (Signed)
Patient ID: Alexa Hess, female   DOB: 1971/09/21, 43 y.o.   MRN: [id number]   Subjective:    Patient ID: Alexa Hess, female    DOB: [birth year], 43 y.o.   MRN: [id number]  HPI  CC: pain in lower stomach, around back  # Lower abdominal and flank pain:  Duration: 1 week  Pain located: circumferential around abdomen and back, piercing on left back. Piercing pain is 10/10 that occurs off/on every few minutes (particularly last night)  Tried taking: nothing  Burning with urination; urine is darker/cloudy.   Denies recent sexual activity, last period was 2-3 weeks ago (States she has been regular) ROS: +fevers/chills, nausea but no vomiting, +heartburn, no CP, no SOB, no diarrhea, +constipation but went today.  Review of Systems   See HPI for ROS. All other systems reviewed and are negative. Objective:  BP 118/88  Pulse 92  Temp(Src) 98.3 F (36.8 C) (Oral)  Ht  (1.727 m)  Wt 195 lb (88.451 kg)  BMI 29.66 kg/m2  LMP 08/03/2014 Vitals reviewed  General: NAD, appears nervous and has some whole body shaking/tremor during exam. CV: RRR, normal s1/s2, no murmurs Resp: CTAB, normal effort Abdomen: mildly tender lower quadrants, no rebound or guarding, no masses palpable Back: mild tenderness to left flank palpation, minimal to no CVA tenderness with percussion GU: patient declines    Assessment & Plan:  See Problem List Documentation

## 2014-08-24 LAB — URINE CULTURE
Colony Count: NO GROWTH
Organism ID, Bacteria: NO GROWTH

## 2014-08-25 ENCOUNTER — Telehealth: Payer: Self-pay | Admitting: Family Medicine

## 2014-08-25 NOTE — Telephone Encounter (Signed)
Called patient to inform her of negative urine culture results. Called home and mobile numbers, both received no answer, voicemail box full so unable to leave message. -Dr Waynetta Sandy

## 2014-08-27 NOTE — Telephone Encounter (Signed)
Spoke with patient regarding negative urine culture results. She is feeling better, but still not back to normal. She understands she does not need to take the printed antibiotic prescription I had given her. -Dr Waynetta Sandy

## 2014-09-10 ENCOUNTER — Encounter: Payer: Self-pay | Admitting: Family Medicine

## 2014-09-10 ENCOUNTER — Ambulatory Visit (INDEPENDENT_AMBULATORY_CARE_PROVIDER_SITE_OTHER): Payer: 59 | Admitting: Family Medicine

## 2014-09-10 ENCOUNTER — Other Ambulatory Visit (HOSPITAL_COMMUNITY)
Admission: RE | Admit: 2014-09-10 | Discharge: 2014-09-10 | Disposition: A | Discharge status: Home / Self Care | Payer: 59 | Source: Ambulatory Visit | Attending: Family Medicine | Admitting: Family Medicine

## 2014-09-10 VITALS — BP 128/92 | HR 94 | Temp 98.3°F | Wt 195.0 lb

## 2014-09-10 DIAGNOSIS — R300 Dysuria: Secondary | ICD-10-CM

## 2014-09-10 DIAGNOSIS — Z113 Encounter for screening for infections with a predominantly sexual mode of transmission: Secondary | ICD-10-CM | POA: Diagnosis not present

## 2014-09-10 DIAGNOSIS — N898 Other specified noninflammatory disorders of vagina: Secondary | ICD-10-CM

## 2014-09-10 LAB — POCT URINALYSIS DIPSTICK
Bilirubin, UA: NEGATIVE
Glucose, UA: 100
Ketones, UA: NEGATIVE
Nitrite, UA: NEGATIVE
Protein, UA: NEGATIVE
Spec Grav, UA: >=1.03
Urobilinogen, UA: 1
pH, UA: 6

## 2014-09-10 LAB — POCT WET PREP (WET MOUNT)
CLUE CELLS WET PREP WHIFF POC: NEGATIVE
WBC, Wet Prep HPF POC: >20

## 2014-09-10 LAB — POCT UA - MICROSCOPIC ONLY

## 2014-09-10 NOTE — Progress Notes (Signed)
Patient ID: Alexa Hess, female   DOB: 28-Mar-1971, 43 y.o.   MRN: [id number]   [facility] Charlane Ferretti, MD Phone: [phone]  Subjective:  Alexa Hess is a 43 y.o F who presents today for SDA  # UTI sx/vaginal pain -was seen by Dr. Waynetta Sandy 08/23/2014- who did u/a and culture which were both negative; discussed at that time the possibility of further imaging ie KUB/CT/US but pt declined, questionable renal colic? -back pain resolved, still has pain suprapubically, described as nagging and aching pain with sharp stabbing pain, pain comes and goes; also feels tired; made better by some advil -LMP sept 11th- extremely heavy period, week prior to had some slimy white discharge; also had cycle at the end of august described as dark brownish red blood -had unprotected intercourse for the past year- has had BV in the past- last finished a course of abx around beginning of august; painful intercourse after penetration  -did at one time have some itching  -denies fevers, chills, n/v since seeing Dr. Waynetta Sandy -felt faint a few days ago  All relevant systems were reviewed and were negative unless otherwise noted in the HPI  Past Medical History Reviewed problem list.  Medications- reviewed and updated Current Outpatient Prescriptions  Medication Sig Dispense Refill  . cephALEXin (KEFLEX) 500 MG capsule Take 1 capsule (500 mg total) by mouth 4 (four) times daily.  28 capsule  0  . citalopram (CELEXA) 20 MG tablet Take 0.5 tablets (10 mg total) by mouth daily.  30 tablet  1  . fluconazole (DIFLUCAN) 150 MG tablet Take 1 tablet (150 mg total) by mouth once.  1 tablet  3  . ibuprofen (ADVIL,MOTRIN) 800 MG tablet Take 1 tablet (800 mg total) by mouth every 8 (eight) hours as needed.  60 tablet  1  . metroNIDAZOLE (FLAGYL) 500 MG tablet Take 1 tablet (500 mg total) by mouth 2 (two) times daily.  14 tablet  3  . nicotine polacrilex (NICORELIEF) 2 MG gum Take 1 each (2 mg  total) by mouth as needed for smoking cessation.  100 tablet  0   No current facility-administered medications for this visit.   Chief complaint-noted No additions to family history Social history- patient is a current every day smoker  Objective: BP 128/92  Pulse 94  Temp(Src) 98.3 F (36.8 C) (Oral)  Wt 195 lb (88.451 kg)  LMP 08/27/2014 Gen: NAD, alert, cooperative with exam HEENT: NCAT, EOMI Neck: FROM, supple Abd: SNTND, BS present, no guarding or organomegaly GU: copious frothy green-yellow discharge in the vaginal vault; no cervical/vaginal or external lesions noted  Ext: No edema, warm, normal tone, moves UE/LE spontaneously Neuro: Alert and oriented, No gross deficits Skin: no rashes no lesions  Assessment/Plan: See problem based a/p

## 2014-09-10 NOTE — Assessment & Plan Note (Signed)
Accompanied by abdominal discomfort Wet prep with no yeast or BV but lots of whites Suspect GC or C Plan: await further studies Rehydrate (spec grav >1.030) Avoid intercourse for now  Presumptive tx for yeast if not improved Then follow with transvag US to assess for ovarian path if needed Pt working to est with female provider

## 2014-09-10 NOTE — Patient Instructions (Signed)
Alexa Hess it was great to meet you today!  I am sorry that you are not feeling well. Please make sure to drink plenty of fluid (as you look very dehydrated)  Your test was negative but had lots of white cells in it which can be suggestive of an infection  I will call if any of the other tests seem abnormal  Please return to clinic if symptoms do not improve or worsen We may need to get an ultrasound of your pelvis Feel better soon Charlane Ferretti, MD

## 2014-09-13 ENCOUNTER — Telehealth: Payer: Self-pay | Admitting: Family Medicine

## 2014-09-13 DIAGNOSIS — N898 Other specified noninflammatory disorders of vagina: Secondary | ICD-10-CM

## 2014-09-13 LAB — CERVICOVAGINAL ANCILLARY ONLY
Chlamydia: NEGATIVE
NEISSERIA GONORRHEA: NEGATIVE

## 2014-09-13 MED ORDER — FLUCONAZOLE 150 MG PO TABS: 150.0000 mg | ORAL_TABLET | Freq: Once | ORAL | Status: DC

## 2014-09-13 NOTE — Telephone Encounter (Signed)
Called to let her know neg test results. Will empirically tx for yeast. If not improved after 2 pills pt will call and we will set her up with an ultrasound [facility], MD

## 2014-11-02 ENCOUNTER — Encounter: Payer: Self-pay | Admitting: Family Medicine

## 2014-11-02 ENCOUNTER — Other Ambulatory Visit (HOSPITAL_COMMUNITY)
Admission: RE | Admit: 2014-11-02 | Discharge: 2014-11-02 | Disposition: A | Discharge status: Home / Self Care | Payer: 59 | Source: Ambulatory Visit | Attending: Family Medicine | Admitting: Family Medicine

## 2014-11-02 ENCOUNTER — Ambulatory Visit (INDEPENDENT_AMBULATORY_CARE_PROVIDER_SITE_OTHER): Payer: 59 | Admitting: Family Medicine

## 2014-11-02 VITALS — BP 121/80 | HR 109 | Temp 98.0°F | Ht 68.0 in | Wt 202.3 lb

## 2014-11-02 DIAGNOSIS — Z124 Encounter for screening for malignant neoplasm of cervix: Secondary | ICD-10-CM

## 2014-11-02 DIAGNOSIS — N200 Calculus of kidney: Secondary | ICD-10-CM

## 2014-11-02 DIAGNOSIS — D582 Other hemoglobinopathies: Secondary | ICD-10-CM

## 2014-11-02 DIAGNOSIS — Z1151 Encounter for screening for human papillomavirus (HPV): Secondary | ICD-10-CM | POA: Insufficient documentation

## 2014-11-02 DIAGNOSIS — Z113 Encounter for screening for infections with a predominantly sexual mode of transmission: Secondary | ICD-10-CM | POA: Insufficient documentation

## 2014-11-02 DIAGNOSIS — N898 Other specified noninflammatory disorders of vagina: Secondary | ICD-10-CM

## 2014-11-02 DIAGNOSIS — Z01419 Encounter for gynecological examination (general) (routine) without abnormal findings: Secondary | ICD-10-CM

## 2014-11-02 LAB — POCT WET PREP (WET MOUNT)
Clue Cells Wet Prep Whiff POC: NEGATIVE
WBC, Wet Prep HPF POC: >20

## 2014-11-02 NOTE — Patient Instructions (Signed)
We will call in the next few days with the results of your lab work.  Please come back and see [place] if your pain gets worse or you have any other concerns.  Health Maintenance Adopting a healthy lifestyle and getting preventive care can go a long way to promote health and wellness. Talk with your health care provider about what schedule of regular examinations is right for you. This is a good chance for you to check in with your provider about disease prevention and staying healthy. In between checkups, there are plenty of things you can do on your own. Experts have done a lot of research about which lifestyle changes and preventive measures are most likely to keep you healthy. Ask your health care provider for more information. WEIGHT AND DIET  Eat a healthy diet  Be sure to include plenty of vegetables, fruits, low-fat dairy products, and lean protein.  Do not eat a lot of foods high in solid fats, added sugars, or salt.  Get regular exercise. This is one of the most important things you can do for your health.  Most adults should exercise for at least 150 minutes each week. The exercise should increase your heart rate and make you sweat (moderate-intensity exercise).  Most adults should also do strengthening exercises at least twice a week. This is in addition to the moderate-intensity exercise.  Maintain a healthy weight  Body mass index (BMI) is a measurement that can be used to identify possible weight problems. It estimates body fat based on height and weight. Your health care provider can help determine your BMI and help you achieve or maintain a healthy weight.  For females 42 years of age and older:   A BMI below 18.5 is considered underweight.  A BMI of 18.5 to 24.9 is normal.  A BMI of 25 to 29.9 is considered overweight.  A BMI of 30 and above is considered obese.  Watch levels of cholesterol and blood lipids  You should start having your blood tested for lipids and  cholesterol at 42 years of age, then have this test every 5 years.  You may need to have your cholesterol levels checked more often if:  Your lipid or cholesterol levels are high.  You are older than 42 years of age.  You are at high risk for heart disease.  CANCER SCREENING   Lung Cancer  Lung cancer screening is recommended for adults 40-88 years old who are at high risk for lung cancer because of a history of smoking.  A yearly low-dose CT scan of the lungs is recommended for people who:  Currently smoke.  Have quit within the past 15 years.  Have at least a 30-pack-year history of smoking. A pack year is smoking an average of one pack of cigarettes a day for 1 year.  Yearly screening should continue until it has been 15 years since you quit.  Yearly screening should stop if you develop a health problem that would prevent you from having lung cancer treatment.  Breast Cancer  Practice breast self-awareness. This means understanding how your breasts normally appear and feel.  It also means doing regular breast self-exams. Let your health care provider know about any changes, no matter how small.  If you are in your 42s or 40s, you should have a clinical breast exam (CBE) by a health care provider every 1-3 years as part of a regular health exam.  If you are 42 or older, have a CBE  every year. Also consider having a breast X-ray (mammogram) every year.  If you have a family history of breast cancer, talk to your health care provider about genetic screening.  If you are at high risk for breast cancer, talk to your health care provider about having an MRI and a mammogram every year.  Breast cancer gene (BRCA) assessment is recommended for women who have family members with BRCA-related cancers. BRCA-related cancers include:  Breast.  Ovarian.  Tubal.  Peritoneal cancers.  Results of the assessment will determine the need for genetic counseling and BRCA1 and BRCA2  testing. Cervical Cancer Routine pelvic examinations to screen for cervical cancer are no longer recommended for nonpregnant women who are considered low risk for cancer of the pelvic organs (ovaries, uterus, and vagina) and who do not have symptoms. A pelvic examination may be necessary if you have symptoms including those associated with pelvic infections. Ask your health care provider if a screening pelvic exam is right for you.   The Pap test is the screening test for cervical cancer for women who are considered at risk.  If you had a hysterectomy for a problem that was not cancer or a condition that could lead to cancer, then you no longer need Pap tests.  If you are older than 65 years, and you have had normal Pap tests for the past 10 years, you no longer need to have Pap tests.  If you have had past treatment for cervical cancer or a condition that could lead to cancer, you need Pap tests and screening for cancer for at least 20 years after your treatment.  If you no longer get a Pap test, assess your risk factors if they change (such as having a new sexual partner). This can affect whether you should start being screened again.  Some women have medical problems that increase their chance of getting cervical cancer. If this is the case for you, your health care provider may recommend more frequent screening and Pap tests.  The human papillomavirus (HPV) test is another test that may be used for cervical cancer screening. The HPV test looks for the virus that can cause cell changes in the cervix. The cells collected during the Pap test can be tested for HPV.  The HPV test can be used to screen women 42 years of age and older. Getting tested for HPV can extend the interval between normal Pap tests from three to five years.  An HPV test also should be used to screen women of any age who have unclear Pap test results.  After 42 years of age, women should have HPV testing as often as Pap  tests.  Colorectal Cancer  This type of cancer can be detected and often prevented.  Routine colorectal cancer screening usually begins at 41 years of age and continues through 42 years of age.  Your health care provider may recommend screening at an earlier age if you have risk factors for colon cancer.  Your health care provider may also recommend using home test kits to check for hidden blood in the stool.  A small camera at the end of a tube can be used to examine your colon directly (sigmoidoscopy or colonoscopy). This is done to check for the earliest forms of colorectal cancer.  Routine screening usually begins at age 25.  Direct examination of the colon should be repeated every 5-10 years through 42 years of age. However, you may need to be screened more often if  early forms of precancerous polyps or small growths are found. Skin Cancer  Check your skin from head to toe regularly.  Tell your health care provider about any new moles or changes in moles, especially if there is a change in a mole's shape or color.  Also tell your health care provider if you have a mole that is larger than the size of a pencil eraser.  Always use sunscreen. Apply sunscreen liberally and repeatedly throughout the day.  Protect yourself by wearing long sleeves, pants, a wide-brimmed hat, and sunglasses whenever you are outside. HEART DISEASE, DIABETES, AND HIGH BLOOD PRESSURE   Have your blood pressure checked at least every 1-2 years. High blood pressure causes heart disease and increases the risk of stroke.  If you are between 65 years and 59 years old, ask your health care provider if you should take aspirin to prevent strokes.  Have regular diabetes screenings. This involves taking a blood sample to check your fasting blood sugar level.  If you are at a normal weight and have a low risk for diabetes, have this test once every three years after 42 years of age.  If you are overweight and  have a high risk for diabetes, consider being tested at a younger age or more often. PREVENTING INFECTION  Hepatitis B  If you have a higher risk for hepatitis B, you should be screened for this virus. You are considered at high risk for hepatitis B if:  You were born in a country where hepatitis B is common. Ask your health care provider which countries are considered high risk.  Your parents were born in a high-risk country, and you have not been immunized against hepatitis B (hepatitis B vaccine).  You have HIV or AIDS.  You use needles to inject street drugs.  You live with someone who has hepatitis B.  You have had sex with someone who has hepatitis B.  You get hemodialysis treatment.  You take certain medicines for conditions, including cancer, organ transplantation, and autoimmune conditions. Hepatitis C  Blood testing is recommended for:  Everyone born from [birth year] through 1964.  Anyone with known risk factors for hepatitis C. Sexually transmitted infections (STIs)  You should be screened for sexually transmitted infections (STIs) including gonorrhea and chlamydia if:  You are sexually active and are younger than 42 years of age.  You are older than 42 years of age and your health care provider tells you that you are at risk for this type of infection.  Your sexual activity has changed since you were last screened and you are at an increased risk for chlamydia or gonorrhea. Ask your health care provider if you are at risk.  If you do not have HIV, but are at risk, it may be recommended that you take a prescription medicine daily to prevent HIV infection. This is called pre-exposure prophylaxis (PrEP). You are considered at risk if:  You are sexually active and do not regularly use condoms or know the HIV status of your partner(s).  You take drugs by injection.  You are sexually active with a partner who has HIV. Talk with your health care provider about whether you  are at high risk of being infected with HIV. If you choose to begin PrEP, you should first be tested for HIV. You should then be tested every 3 months for as long as you are taking PrEP.  PREGNANCY   If you are premenopausal and you may become pregnant, ask  your health care provider about preconception counseling.  If you may become pregnant, take 400 to 800 micrograms (mcg) of folic acid every day.  If you want to prevent pregnancy, talk to your health care provider about birth control (contraception). OSTEOPOROSIS AND MENOPAUSE   Osteoporosis is a disease in which the bones lose minerals and strength with aging. This can result in serious bone fractures. Your risk for osteoporosis can be identified using a bone density scan.  If you are 76 years of age or older, or if you are at risk for osteoporosis and fractures, ask your health care provider if you should be screened.  Ask your health care provider whether you should take a calcium or vitamin D supplement to lower your risk for osteoporosis.  Menopause may have certain physical symptoms and risks.  Hormone replacement therapy may reduce some of these symptoms and risks. Talk to your health care provider about whether hormone replacement therapy is right for you.  HOME CARE INSTRUCTIONS   Schedule regular health, dental, and eye exams.  Stay current with your immunizations.   Do not use any tobacco products including cigarettes, chewing tobacco, or electronic cigarettes.  If you are pregnant, do not drink alcohol.  If you are breastfeeding, limit how much and how often you drink alcohol.  Limit alcohol intake to no more than 1 drink per day for nonpregnant women. One drink equals 12 ounces of beer, 5 ounces of wine, or 1 ounces of hard liquor.  Do not use street drugs.  Do not share needles.  Ask your health care provider for help if you need support or information about quitting drugs.  Tell your health care provider if  you often feel depressed.  Tell your health care provider if you have ever been abused or do not feel safe at home. Document Released: 06/25/2011 Document Revised: 04/26/2014 Document Reviewed: 11/11/2013 [facility] Patient Information 2015 [place], [place]. This information is not intended to replace advice given to you by your health care provider. Make sure you discuss any questions you have with your health care provider.

## 2014-11-03 LAB — LIPID PANEL
Cholesterol: 223 mg/dL — ABNORMAL HIGH (ref 0–200)
HDL: 52 mg/dL (ref >39)
LDL Cholesterol: 135 mg/dL — ABNORMAL HIGH (ref 0–99)
TRIGLYCERIDES: 182 mg/dL — AB (ref <150)
Total CHOL/HDL Ratio: 4.3 Ratio
VLDL: 36 mg/dL (ref 0–40)

## 2014-11-03 LAB — CBC
HEMATOCRIT: 42.5 % (ref 36.0–46.0)
Hemoglobin: 14.8 g/dL (ref 12.0–15.0)
MCH: 31.8 pg (ref 26.0–34.0)
MCHC: 34.8 g/dL (ref 30.0–36.0)
MCV: 91.2 fL (ref 78.0–100.0)
PLATELETS: 304 10*3/uL (ref 150–400)
RBC: 4.66 MIL/uL (ref 3.87–5.11)
RDW: 14.7 % (ref 11.5–15.5)
WBC: 9.1 10*3/uL (ref 4.0–10.5)

## 2014-11-03 LAB — BASIC METABOLIC PANEL
BUN: 10 mg/dL (ref 6–23)
CHLORIDE: 103 meq/L (ref 96–112)
CO2: 24 mEq/L (ref 19–32)
Calcium: 9.9 mg/dL (ref 8.4–10.5)
Creat: 1 mg/dL (ref 0.50–1.10)
Glucose, Bld: 81 mg/dL (ref 70–99)
POTASSIUM: 4.5 meq/L (ref 3.5–5.3)
Sodium: 137 mEq/L (ref 135–145)

## 2014-11-03 LAB — RPR

## 2014-11-04 LAB — CERVICOVAGINAL ANCILLARY ONLY
Chlamydia: NEGATIVE
NEISSERIA GONORRHEA: NEGATIVE

## 2014-11-05 ENCOUNTER — Other Ambulatory Visit: Payer: Self-pay | Admitting: Family Medicine

## 2014-11-05 LAB — CYTOLOGY - PAP

## 2014-11-08 ENCOUNTER — Telehealth: Payer: Self-pay | Admitting: *Deleted

## 2014-11-08 NOTE — Telephone Encounter (Signed)
Pt informed. [place], [place] CMA 

## 2014-11-08 NOTE — Telephone Encounter (Signed)
-----   Message from Abram SanderElena M Adamo, MD sent at 11/05/2014  4:17 PM EST ----- Please inform patient that all of her recent labs were normal, except for her cholesterol which was slightly high but not high enough that she needs a medication for it. I recommend she decrease fried foods and red meat and increase vegetables in her diet. Exercise could also help improve her cholesterol. She will need another pap smear in 5 years.

## 2014-11-11 NOTE — Progress Notes (Signed)
Patient ID: Webb SilversmithKimberly M Loge, female   DOB: 10/08/1971, 43 y.o.   MRN: [id number] HPI:  Patient presents today for a well woman exam.   Concerns today: gradually worsening vision over the past few years Periods: regular, normal flow Contraception: none Pelvic symptoms: discharge, no itching Sexual activity: yes, 1 female partner, recently broke up and got back together STD Screening: desires today, wants to wait for HIV until 3 months from when she was broken up Pap smear status: due today Exercise: walks 2-3 x/week Diet: well balanced Smoking: current smoker Alcohol: 2-4 drinks/month Drugs: marijuana Advance directives: none  ROS: See HPI  PMFSH:  Cancers in family: none  PHYSICAL EXAM: BP 121/80 mmHg  Pulse 109  Temp(Src) 98 F (36.7 C) (Oral)  Ht 5\' 8"  (1.727 m)  Wt 202 lb 4.8 oz (91.763 kg)  BMI 30.77 kg/m2  LMP 10/24/2014 Gen: NAD, pleasant, cooperative HEENT: NCAT, no palpable thyromegaly or anterior cervical lymphadenopathy Heart: RRR, no murmurs Lungs: CTAB, NWOB Abdomen: soft, nontender to palpation Neuro: grossly nonfocal, speech normal GU: normal appearing external genitalia without lesions. Vagina is moist with copious white lumpy discharge. Cervix normal in appearance. No cervical motion tenderness or tenderness on bimanual exam. No adnexal masses.   ASSESSMENT/PLAN:  # Health maintenance:  -STD screening: RPR, gc/ct today, future order placed for HIV -pap smear: today -mammogram: not indicated -lipid screening: today -handout given on health maintenance topics -bmet and cbc desired to assess for anemia and kidney function  FOLLOW UP: F/u in 1 year for well woman exam, will call with lab results  Beverely LowElena Adamo, MD, MPH [facility] PGY-2 11/11/2014 4:47 PM

## 2015-03-31 ENCOUNTER — Ambulatory Visit (INDEPENDENT_AMBULATORY_CARE_PROVIDER_SITE_OTHER): Payer: 59 | Admitting: Family Medicine

## 2015-03-31 ENCOUNTER — Encounter: Payer: Self-pay | Admitting: Family Medicine

## 2015-03-31 VITALS — BP 121/79 | HR 100 | Temp 98.4°F | Ht 68.0 in | Wt 199.2 lb

## 2015-03-31 DIAGNOSIS — J209 Acute bronchitis, unspecified: Secondary | ICD-10-CM

## 2015-03-31 DIAGNOSIS — J019 Acute sinusitis, unspecified: Secondary | ICD-10-CM | POA: Insufficient documentation

## 2015-03-31 MED ORDER — AZITHROMYCIN 250 MG PO TABS: ORAL_TABLET | ORAL | Status: DC

## 2015-03-31 NOTE — Assessment & Plan Note (Signed)
Consistent with worsening acute bronchitis in setting of likely initial viral URI (pharyngitis and +sick contacts). Concern with duration >1-2 week, recurrent episode - Possible component of sinusitis, however less convincing clinical symptoms today - Afebrile, no focal signs of infection, lungs with scattered rhonchi and clear with cough, no focal crackles - H/o smoking but no prior COPD (no wheezing)  Plan: 1. Start Azithromycin Z pak x 5 day course - considered Augmentin for sinus coverage, however seems acute bronchitis more likely 2. Recommend trial OTC - Mucinex, Tylenol/Ibuprofen PRN, Nasal saline, lozenges, tea with honey/lemon 3. Note for out of work today, return tomorrow 4. RTC 7-10 days if no improvement, red flags given for return sooner - If returned would consider switch to Doxy vs Levaquin for bronchitis vs Augmentin for sinusitis if cough improved but sinus symptoms primary

## 2015-03-31 NOTE — Patient Instructions (Signed)
Dear Alexa RudKimberly Hess, Thank you for coming in to clinic today. It was good to see you again!  1. It sounds like your initial viral infection has worsened to a bacterial infection. Most convincingly an acute bronchitis (mostly in your lungs), however I believe your sinuses may be involved too. Treatment with Azithromycin Z pak - take 2 tablets Day 1, then 1 tablet for 4 days. 2. Start Mucinex (regular or DM with cough suppressant), may continue DayQuil / NyQuil or add Ibuprofen as needed 3. Stay well hydrated, drink additional water to thin your congestion. Use Nasal Saline regularly  Please schedule a follow-up appointment with Dr. Randolm IdolFletke or myself in 7 to 10 days if not improved or worsening, may switch antibiotics.  If you have any other questions or concerns, please feel free to call the clinic to contact me. You may also schedule an earlier appointment if necessary.  However, if your symptoms get significantly worse, please go to the Emergency Department to seek immediate medical attention.  Alexa PilarAlexander Karamalegos, DO [facility]   Acute Bronchitis Bronchitis is inflammation of the airways that extend from the windpipe into the lungs (bronchi). The inflammation often causes mucus to develop. This leads to a cough, which is the most common symptom of bronchitis.  In acute bronchitis, the condition usually develops suddenly and goes away over time, usually in a couple weeks. Smoking, allergies, and asthma can make bronchitis worse. Repeated episodes of bronchitis may cause further lung problems.  CAUSES Acute bronchitis is most often caused by the same virus that causes a cold. The virus can spread from person to person (contagious) through coughing, sneezing, and touching contaminated objects. SIGNS AND SYMPTOMS   Cough.   Fever.   Coughing up mucus.   Body aches.   Chest congestion.   Chills.   Shortness of breath.   Sore throat.  DIAGNOSIS  Acute  bronchitis is usually diagnosed through a physical exam. Your health care provider will also ask you questions about your medical history. Tests, such as chest X-rays, are sometimes done to rule out other conditions.  TREATMENT  Acute bronchitis usually goes away in a couple weeks. Oftentimes, no medical treatment is necessary. Medicines are sometimes given for relief of fever or cough. Antibiotic medicines are usually not needed but may be prescribed in certain situations. In some cases, an inhaler may be recommended to help reduce shortness of breath and control the cough. A cool mist vaporizer may also be used to help thin bronchial secretions and make it easier to clear the chest.  HOME CARE INSTRUCTIONS  Get plenty of rest.   Drink enough fluids to keep your urine clear or pale yellow (unless you have a medical condition that requires fluid restriction). Increasing fluids may help thin your respiratory secretions (sputum) and reduce chest congestion, and it will prevent dehydration.   Take medicines only as directed by your health care provider.  If you were prescribed an antibiotic medicine, finish it all even if you start to feel better.  Avoid smoking and secondhand smoke. Exposure to cigarette smoke or irritating chemicals will make bronchitis worse. If you are a smoker, consider using nicotine gum or skin patches to help control withdrawal symptoms. Quitting smoking will help your lungs heal faster.   Reduce the chances of another bout of acute bronchitis by washing your hands frequently, avoiding people with cold symptoms, and trying not to touch your hands to your mouth, nose, or eyes.   Keep  all follow-up visits as directed by your health care provider.  SEEK MEDICAL CARE IF: Your symptoms do not improve after 1 week of treatment.  SEEK IMMEDIATE MEDICAL CARE IF:  You develop an increased fever or chills.   You have chest pain.   You have severe shortness of  breath.  You have bloody sputum.   You develop dehydration.  You faint or repeatedly feel like you are going to pass out.  You develop repeated vomiting.  You develop a severe headache. MAKE SURE YOU:   Understand these instructions.  Will watch your condition.  Will get help right away if you are not doing well or get worse. Document Released: 01/17/2005 Document Revised: 04/26/2014 Document Reviewed: 06/02/2013 [facility] Patient Information 2015 [place], [place]. This information is not intended to replace advice given to you by your health care provider. Make sure you discuss any questions you have with your health care provider.

## 2015-03-31 NOTE — Progress Notes (Signed)
I was the preceptor on the day of this visit.   Keandre Linden MD  

## 2015-03-31 NOTE — Progress Notes (Signed)
   Subjective:    Patient ID: Webb SilversmithKimberly M Heidler, female    DOB: [birth year], 44 y.o.   MRN: [id number]  Patient presents for a same day appointment.  HPI  BRONCHITIS: - Reported that symptoms started about 2 weeks ago with "tickling or unusual feeling in throat, scratchy" and nasal congestion, cough, thought she was catching a cold. Seemed to improve after 1-2 days, then has progressively worsened over past 2 weeks. Currently describes thick nasal congestion and thick productive cough, occasional coughing spells with some difficulty catching good breath. - Tried Advil cold & sinus, switched to NyQuil now without significant relief - Multiple sick contacts with similar symptoms at work. - Did not take influenza vaccine this season - Admit some nausea and SOB with coughing spells, without vomiting, occasional chills, Right earache - Denies any recorded fevers, CP, myalgias, abdominal pain, diarrhea, HA  I have reviewed and updated the following as appropriate: allergies and current medications  Social Hx: active smoker  Review of Systems  See above HPI    Objective:   Physical Exam  BP 121/79 mmHg  Pulse 100  Temp(Src) 98.4 F (36.9 C) (Oral)  Ht 5\' 8"  (1.727 m)  Wt 199 lb 3.2 oz (90.357 kg)  BMI 30.30 kg/m2  SpO2 98%  LMP 03/22/2015  Gen - currently sick but overall well-appearing, cooperative, NAD HEENT - NCAT frontal and maxillary sinuses non-tender, PERRL, b/l TM's clear without erythema or effusion, nares w/ congestion, oropharynx clear without swelling, exudates, and appears symmetrical, MMM Neck - supple, non-tender, no LAD Heart - RRR, no murmurs heard Lungs - Mostly CTAB with some scattered rhonchi clear with deep cough. No wheezing or focal crackles. Normal work of breathing. Good air movement. Speaks full sentences. Ext - non-tender, no edema, peripheral pulses intact +2 b/l Skin - warm, dry     Assessment & Plan:   See specific A&P problem list for  details.

## 2015-04-08 ENCOUNTER — Telehealth: Payer: Self-pay | Admitting: Family Medicine

## 2015-04-08 DIAGNOSIS — J019 Acute sinusitis, unspecified: Secondary | ICD-10-CM

## 2015-04-08 MED ORDER — LEVOFLOXACIN 500 MG PO TABS: 500.0000 mg | ORAL_TABLET | Freq: Every day | ORAL | Status: DC

## 2015-04-08 NOTE — Telephone Encounter (Signed)
Will forward to PCP and last MD seen. 

## 2015-04-08 NOTE — Telephone Encounter (Signed)
Called patient back. Spoke with Alexa Hess. She reports that her symptoms had initially improved after taking Azithromycin, and then seemed to get worse with increased thick nasal congestion, sinus pain and pressure, in addition to chest congestion with cough. Previously seen on 03/31/15, at that time decision to treat with Z-pak for likely bronchitis, considered sinusitis given developing sinus symptoms. Currently afebrile and no other new symptoms. Advised patient that we could switch to alternative antibiotic since symptoms present for >3 weeks now. Switched to Levaquin 500mg  daily x 7 days for better sinusitis coverage, also could cover bronchitis. Advised patient to complete this course, and she would likely need to be seen if no significant improvement or worsening within 5-7 days.  Saralyn PilarAlexander Karamalegos, DO [facility], PGY-2

## 2015-04-08 NOTE — Telephone Encounter (Signed)
Pt is calling re: her last visit, was dx with acute bronchitis, was given abx but wanted to let MD know it hasn't cleared up, her chest feels like she has tightness again. Wants to know what MD wants her to do?

## 2015-04-14 ENCOUNTER — Telehealth: Payer: Self-pay | Admitting: Family Medicine

## 2015-04-14 DIAGNOSIS — N760 Acute vaginitis: Secondary | ICD-10-CM

## 2015-04-14 DIAGNOSIS — B9689 Other specified bacterial agents as the cause of diseases classified elsewhere: Secondary | ICD-10-CM

## 2015-04-14 NOTE — Telephone Encounter (Signed)
Pt called because she is taking the antibiotics for her bronchitis and now has a yeast infection and would like some Metronidazole called in. jw

## 2015-04-15 MED ORDER — METRONIDAZOLE 500 MG PO TABS: 500.0000 mg | ORAL_TABLET | Freq: Two times a day (BID) | ORAL | Status: DC

## 2015-04-15 NOTE — Telephone Encounter (Signed)
Called patient back. Spoke with patient, she reported that her recent infection has completely resolved on the Levaquin x 7 day course, resolved sinus congestion and bronchitis. She now has had increased vaginal discharge after taking the antibiotics. Tried OTC monostat without relief, and believes that it has turned "more into bacterial infection", which she commonly has had recurrent BV in the past. She requested Metronidazole course that normally works for her, previously had refills on hold but stated that they just ran out. Additionally has been Diflucan before, but requested Metronidazole.  I advised her again that we typically do not treat over the phone, but given her chronic recurrent h/o BV, I agreed to a trial of Metronidazole 500mg  BID x 7 days. Specific return criteria if symptoms worsening or not improving, then she would need to come in to be evaluated, pelvic exam with wet prep, concern for possible yeast infection (especially in setting of antibiotics). She will need to follow-up with PCP to discuss long-term Metronidazole rx on hold as requested.  Saralyn PilarAlexander Karamalegos, DO [facility], PGY-2

## 2015-04-15 NOTE — Addendum Note (Signed)
Addended by: Smitty CordsKARAMALEGOS, Dorothee Napierkowski J on: 04/15/2015 02:04 PM   Modules accepted: Orders

## 2015-04-15 NOTE — Telephone Encounter (Signed)
Will forward to Dr. Althea CharonKaramalegos who recently cared for Ms. Pfost.

## 2015-11-03 ENCOUNTER — Encounter: Payer: 59 | Admitting: Family Medicine

## 2015-11-11 ENCOUNTER — Encounter: Payer: Self-pay | Admitting: Family Medicine

## 2015-11-11 ENCOUNTER — Ambulatory Visit (INDEPENDENT_AMBULATORY_CARE_PROVIDER_SITE_OTHER): Payer: 59 | Admitting: Family Medicine

## 2015-11-11 VITALS — BP 122/74 | HR 98 | Temp 97.9°F | Ht 68.0 in | Wt 181.6 lb

## 2015-11-11 DIAGNOSIS — Z0000 Encounter for general adult medical examination without abnormal findings: Secondary | ICD-10-CM

## 2015-11-11 DIAGNOSIS — H61891 Other specified disorders of right external ear: Secondary | ICD-10-CM

## 2015-11-11 DIAGNOSIS — H61899 Other specified disorders of external ear, unspecified ear: Secondary | ICD-10-CM | POA: Insufficient documentation

## 2015-11-11 DIAGNOSIS — F17200 Nicotine dependence, unspecified, uncomplicated: Secondary | ICD-10-CM | POA: Diagnosis not present

## 2015-11-11 LAB — LIPID PANEL
Cholesterol: 211 mg/dL — ABNORMAL HIGH (ref 125–200)
HDL: 55 mg/dL (ref >=46)
LDL Cholesterol: 138 mg/dL — ABNORMAL HIGH (ref <130)
TRIGLYCERIDES: 89 mg/dL (ref <150)
Total CHOL/HDL Ratio: 3.8 Ratio (ref <=5.0)
VLDL: 18 mg/dL (ref <30)

## 2015-11-11 LAB — CBC
HCT: 41.1 % (ref 36.0–46.0)
Hemoglobin: 14.4 g/dL (ref 12.0–15.0)
MCH: 31.7 pg (ref 26.0–34.0)
MCHC: 35 g/dL (ref 30.0–36.0)
MCV: 90.5 fL (ref 78.0–100.0)
MPV: 10.6 fL (ref 8.6–12.4)
Platelets: 319 10*3/uL (ref 150–400)
RBC: 4.54 MIL/uL (ref 3.87–5.11)
RDW: 14.6 % (ref 11.5–15.5)
WBC: 7.6 10*3/uL (ref 4.0–10.5)

## 2015-11-11 LAB — BASIC METABOLIC PANEL
BUN: 10 mg/dL (ref 7–25)
CO2: 29 mmol/L (ref 20–31)
CREATININE: 1.03 mg/dL (ref 0.50–1.10)
Calcium: 9.8 mg/dL (ref 8.6–10.2)
Chloride: 104 mmol/L (ref 98–110)
Glucose, Bld: 85 mg/dL (ref 65–99)
Potassium: 4.4 mmol/L (ref 3.5–5.3)
Sodium: 140 mmol/L (ref 135–146)

## 2015-11-11 MED ORDER — NICOTINE POLACRILEX 4 MG MT LOZG: 4.0000 mg | LOZENGE | OROMUCOSAL | Status: DC | PRN

## 2015-11-11 NOTE — Progress Notes (Signed)
44 y.o. year old female presents for well woman/preventative visit and annual GYN examination.  Acute Concerns: Right ear pain/fullness at times, no associated fevers/chills, no drainage  Diet: Decreased soda and red meat intake over past 6 months, recently completed a "fast" with her church  Exercise: Walking 3 times per week  Sexual/Birth History: One son Not currently sexually active  Birth Control: Abstinence  POA/Living Will: None  Social:  Social History   Social History  . Marital Status: Single    Spouse Name: N/A  . Number of Children: N/A  . Years of Education: N/A   Social History Main Topics  . Smoking status: Current Every Day Smoker -- 0.50 packs/day for 10 years    Types: Cigarettes  . Smokeless tobacco: Never Used  . Alcohol Use: 0.6 oz/week    1 Glasses of wine per week  . Drug Use: None  . Sexual Activity: Not Asked   Other Topics Concern  . None   Social History Narrative   One Son   She is not married   Patient is interested in smoking cessation, previously attempted Wellbutrin however stopped due to side effects, also used Commit lozenge which provided some help with cravings  Immunization:  Tdap/TD: due (declined)  Influenza: due (declined)  Pneumococcal: not a candidate (if continues to smoke consider)  Herpes Zoster: not a candidate  Cancer Screening:  Pap Smear: completed in 10/2014 (negative)  Mammogram: Never completed, no FH breast CA, no breast issues  Colonoscopy: Not a candidate  Dexa: Not a candidate  Physical Exam: VITALS: reviewed RUE:[id number]:pleasant female, NAD, African American HEENT: Normocephalic, PERRL, EOMI, TM's pearly grey, mild irritation of right EAC (no drainage), nasal septum midline, MMM, uvula midline, neck supple, no adenopathy, no thyromegaly CARDIAC: RRR, S1 and S2 present, no murmur RESP: CTAB, normal effort BREAST:Deferred  BJY:NWGNABD:soft, no tenderness, normal bowel sounds GU/GYN:Deferred.  EXT: no edema, 2+  radial and DP pulses SKIN: no rash  ASSESSMENT & PLAN: 44 y.o. female presents for annual well woman/preventative exam.  Preventative health care Annual preventative/well woman exam. -encouraged patient to continue to practice good lifestyle modifications -declined flu and Tdap today -Not due for PAP -Declined Mammogram at this time -Routine labs ordered  Irritation of external ear canal Attempt trial of mineral oil.   TOBACCO USER Patient interested in smoking cessation. Start Commit Lozenge.

## 2015-11-11 NOTE — Assessment & Plan Note (Signed)
Annual preventative/well woman exam. -encouraged patient to continue to practice good lifestyle modifications -declined flu and Tdap today -Not due for PAP -Declined Mammogram at this time -Routine labs ordered

## 2015-11-11 NOTE — Patient Instructions (Signed)
It was nice to see you today.  Labs - Dr. Randolm IdolFletke will send you a letter or call you with the results.   Congratulations on deciding to quit smoking, start Commit lozenge   Return in 2-3 weeks to follow up on smoking.

## 2015-11-11 NOTE — Assessment & Plan Note (Signed)
Patient interested in smoking cessation. Start Commit Lozenge.

## 2015-11-11 NOTE — Assessment & Plan Note (Signed)
Attempt trial of mineral oil.

## 2015-11-21 ENCOUNTER — Encounter: Payer: Self-pay | Admitting: Family Medicine

## 2015-11-21 DIAGNOSIS — E785 Hyperlipidemia, unspecified: Secondary | ICD-10-CM | POA: Insufficient documentation

## 2015-12-16 ENCOUNTER — Encounter: Payer: Self-pay | Admitting: Family Medicine

## 2015-12-16 ENCOUNTER — Ambulatory Visit (INDEPENDENT_AMBULATORY_CARE_PROVIDER_SITE_OTHER): Payer: 59 | Admitting: Family Medicine

## 2015-12-16 ENCOUNTER — Other Ambulatory Visit (HOSPITAL_COMMUNITY)
Admission: RE | Admit: 2015-12-16 | Discharge: 2015-12-16 | Disposition: A | Discharge status: Home / Self Care | Payer: 59 | Source: Ambulatory Visit | Attending: Family Medicine | Admitting: Family Medicine

## 2015-12-16 VITALS — BP 119/82 | HR 92 | Temp 98.7°F | Ht 68.0 in | Wt 183.2 lb

## 2015-12-16 DIAGNOSIS — R102 Pelvic and perineal pain: Secondary | ICD-10-CM | POA: Diagnosis not present

## 2015-12-16 DIAGNOSIS — N898 Other specified noninflammatory disorders of vagina: Secondary | ICD-10-CM | POA: Diagnosis not present

## 2015-12-16 DIAGNOSIS — N760 Acute vaginitis: Secondary | ICD-10-CM

## 2015-12-16 DIAGNOSIS — Z113 Encounter for screening for infections with a predominantly sexual mode of transmission: Secondary | ICD-10-CM | POA: Diagnosis not present

## 2015-12-16 DIAGNOSIS — R399 Unspecified symptoms and signs involving the genitourinary system: Secondary | ICD-10-CM | POA: Diagnosis not present

## 2015-12-16 LAB — POCT URINALYSIS DIPSTICK
Glucose, UA: NEGATIVE
KETONES UA: 15
Leukocytes, UA: NEGATIVE
Nitrite, UA: NEGATIVE
Protein, UA: 30
Spec Grav, UA: >=1.03
Urobilinogen, UA: 0.2
pH, UA: 6.5

## 2015-12-16 LAB — POCT WET PREP (WET MOUNT): Clue Cells Wet Prep Whiff POC: POSITIVE

## 2015-12-16 LAB — POCT URINE PREGNANCY: Preg Test, Ur: NEGATIVE

## 2015-12-16 MED ORDER — FLUCONAZOLE 150 MG PO TABS
150.0000 mg | ORAL_TABLET | Freq: Once | ORAL | Status: DC
Start: 2015-12-16 — End: 2016-09-07

## 2015-12-16 MED ORDER — METRONIDAZOLE 0.75 % VA GEL: 1.0000 | VAGINAL | Status: DC | PRN

## 2015-12-16 MED ORDER — METRONIDAZOLE 500 MG PO TABS: 500.0000 mg | ORAL_TABLET | Freq: Two times a day (BID) | ORAL | Status: DC

## 2015-12-16 MED ORDER — IBUPROFEN 800 MG PO TABS: 800.0000 mg | ORAL_TABLET | Freq: Three times a day (TID) | ORAL | Status: DC | PRN

## 2015-12-16 NOTE — Patient Instructions (Signed)

## 2015-12-17 NOTE — Assessment & Plan Note (Signed)
Clinical yeast appearance with many clues on wet prep - treat for BV and yeast - gc/ct sent

## 2015-12-17 NOTE — Assessment & Plan Note (Signed)
Pain x1 month since having sex for the first time in years, UA with small blood only (known history of kidney stones), very mild CMT and adnexal tenderness (history of painful ovarian cysts) - rec ibuprofen - gc/ct sent - treating for BV and yeast - f/u in 2 weeks or sooner if not improving

## 2015-12-17 NOTE — Progress Notes (Signed)
   Subjective:   Alexa Hess is a 44 y.o. female with a history of kidney stones and ovarian cysts here for pelvic discomfort  Pt had sex for the first time in years on December second and since then is having lots of pelvic discomfort. They used a condom but there was some contact without the condom and she would like STI testing. She hasn't had itching but feels irritated and hasn't really noticed much discharge. She feels vague achy pain throughout her pelvis.  Review of Systems:  Per HPI. All other systems reviewed and are negative.   PMH, PSH, Medications, Allergies, and FmHx reviewed and updated in EMR.  Social History: current smoker  Objective:  BP 119/82 mmHg  Pulse 92  Temp(Src) 98.7 F (37.1 C) (Oral)  Ht 5\' 8"  (1.727 m)  Wt 183 lb 3.2 oz (83.099 kg)  BMI 27.86 kg/m2  LMP 10/03/2015  Gen:  44 y.o. female in NAD HEENT: NCAT, MMM, EOMI, PERRL, anicteric sclerae CV: RRR, no MRG, no JVD Resp: Non-labored, CTAB, no wheezes noted Abd: Soft, NTND, BS present, no guarding or organomegaly Ext: WWP, no edema MSK: Full ROM, strength intact Neuro: Alert and oriented, speech normal GU: copious thick white discharge, nabothian cyst at 7 O'clock, very mild CMT and adnexal tenderness L>R      Chemistry      Component Value Date/Time   NA 140 11/11/2015 1049   K 4.4 11/11/2015 1049   CL 104 11/11/2015 1049   CO2 29 11/11/2015 1049   BUN 10 11/11/2015 1049   CREATININE 1.03 11/11/2015 1049   CREATININE 1.10 04/03/2010 1955      Component Value Date/Time   CALCIUM 9.8 11/11/2015 1049   ALKPHOS 74 04/03/2010 1955   AST 11 04/03/2010 1955   ALT <8 U/L 04/03/2010 1955   BILITOT 0.4 04/03/2010 1955      Lab Results  Component Value Date   WBC 7.6 11/11/2015   HGB 14.4 11/11/2015   HCT 41.1 11/11/2015   MCV 90.5 11/11/2015   PLT 319 11/11/2015   Lab Results  Component Value Date   TSH 0.614 04/06/2014   Lab Results  Component Value Date   HGBA1C 5.3  08/23/2008   Assessment & Plan:     Alexa Hess is a 44 y.o. female here for pelvic discomfort  Vaginitis and vulvovaginitis Clinical yeast appearance with many clues on wet prep - treat for BV and yeast - gc/ct sent  Pelvic pain in female Pain x1 month since having sex for the first time in years, UA with small blood only (known history of kidney stones), very mild CMT and adnexal tenderness (history of painful ovarian cysts) - rec ibuprofen - gc/ct sent - treating for BV and yeast - f/u in 2 weeks or sooner if not improving      Beverely LowElena Adamo, MD, MPH [facility] PGY-3 12/17/2015 12:59 PM

## 2015-12-23 LAB — CERVICOVAGINAL ANCILLARY ONLY
CHLAMYDIA, DNA PROBE: NEGATIVE
NEISSERIA GONORRHEA: NEGATIVE

## 2016-06-07 ENCOUNTER — Encounter: Payer: Self-pay | Admitting: Internal Medicine

## 2016-06-07 ENCOUNTER — Ambulatory Visit (INDEPENDENT_AMBULATORY_CARE_PROVIDER_SITE_OTHER): Payer: 59 | Admitting: Internal Medicine

## 2016-06-07 VITALS — BP 111/78 | HR 99 | Temp 98.7°F | Ht 68.0 in | Wt 184.8 lb

## 2016-06-07 DIAGNOSIS — R05 Cough: Secondary | ICD-10-CM

## 2016-06-07 DIAGNOSIS — R059 Cough, unspecified: Secondary | ICD-10-CM | POA: Insufficient documentation

## 2016-06-07 DIAGNOSIS — J209 Acute bronchitis, unspecified: Secondary | ICD-10-CM | POA: Diagnosis not present

## 2016-06-07 MED ORDER — AZITHROMYCIN 250 MG PO TABS
ORAL_TABLET | ORAL | Status: DC
Start: 2016-06-07 — End: 2016-06-22

## 2016-06-07 NOTE — Assessment & Plan Note (Signed)
Pt endorsing severe cough, associated with headaches, ear pain, body aches, chills, runny nose, nausea. States she feels the exact same way that she did last year when she had bronchitis. On exam, is ill-appearing with normal work of breathing. She has possible decreased air movement in the left lower/mid lung. - Advised that Pt get an CXR to make sure she does not have pneumonia, but she refused. - Will give Azithromycin x 5 days for treatment of bronchitis - Return precautions given - Follow-up if not improving

## 2016-06-07 NOTE — Patient Instructions (Signed)
It was so nice to meet you!  I have prescribed you an antibiotic called Azithromycin. Please take 2 tablets today and then take 1 tablet daily for 4 additional days.  If you start to feel worse, please come back to see us in clinic.  -Dr. Nancy MarusMayo

## 2016-06-07 NOTE — Progress Notes (Signed)
   [facility] Phone: [phone]  Subjective:  She started feeling sick on Friday. She had a headache and felt like fluid was draining in her ear. She started having chills and body aches on Saturday and Sunday. She has not checked a temperature at home. She also has right ear pain. She has tried hot tea at home. She has tried Advil cold and sinus which didn't really help. She has been coughing too. The cough is worse at night. She coughs so much that she loses her breath. She has been having runny nose. She endorses nausea but no vomiting. She started having back pain yesterday. She is also having shortness of breath that is worse at night. She felt like she was going to faint at work yesterday. She felt this exact same way a year ago when she was diagnosed with bronchitis.  ROS: See HPI for pertinent positives and negatives Past Medical History- GERD, tobacco use, adjustment disorder with anxiety/depression Reviewed problem list.  Medications- reviewed and updated Current Outpatient Prescriptions  Medication Sig Dispense Refill  . fluconazole (DIFLUCAN) 150 MG tablet Take 1 tablet (150 mg total) by mouth once. Repeat in 3 days if symptoms continue 2 tablet 0  . ibuprofen (ADVIL,MOTRIN) 800 MG tablet Take 1 tablet (800 mg total) by mouth every 8 (eight) hours as needed. 60 tablet 1  . metroNIDAZOLE (FLAGYL) 500 MG tablet Take 1 tablet (500 mg total) by mouth 2 (two) times daily. 14 tablet 0  . metroNIDAZOLE (METROGEL VAGINAL) 0.75 % vaginal gel Place 1 Applicatorful vaginally as needed. Insert after sex to prevent recurrent BV 70 g 5  . nicotine polacrilex (COMMIT) 4 MG lozenge Take 1 lozenge (4 mg total) by mouth as needed for smoking cessation. 100 tablet 0   No current facility-administered medications for this visit.   Chief complaint-noted Family history reviewed for today's visit. No changes. Social history- patient is a current smoker  Objective: BP 111/78  mmHg  Pulse 99  Temp(Src) 98.7 F (37.1 C) (Oral)  Ht 5\' 8"  (1.727 m)  Wt 184 lb 12.8 oz (83.825 kg)  BMI 28.11 kg/m2  LMP 05/31/2016 Gen: Ill-appearing female, coughing intermittently throughout exam, in NAD HEENT: NCAT, EOMI, MMM, TMs clear, mild tenderness to palpation of frontal sinuses, oropharynx clear Neck: FROM, supple, no cervical lymphadenopathy CV: RRR, no murmur Resp: Normal work of breathing, ?decreased air movement over left lower/mid lung, no crackles, no wheezing Back: No CVA tenderness Neuro: Alert and oriented, no gross deficits Skin: No rashes, no lesions Psych: Appropriate behavior  Assessment/Plan: Cough: Pt endorsing severe cough, associated with headaches, ear pain, body aches, chills, runny nose, nausea. States she feels the exact same way that she did last year when she had bronchitis. On exam, is ill-appearing with normal work of breathing. She has possible decreased air movement in the left lower/mid lung. - Advised that Pt get an CXR to make sure she does not have pneumonia, but she refused. - Will give Azithromycin x 5 days for treatment of bronchitis - Return precautions given - Follow-up if not improving   Willadean CarolKaty Mayo, MD PGY-1

## 2016-06-22 ENCOUNTER — Telehealth: Payer: Self-pay | Admitting: Family Medicine

## 2016-06-22 MED ORDER — LEVOFLOXACIN 500 MG PO TABS: 500.0000 mg | ORAL_TABLET | Freq: Every day | ORAL | Status: DC

## 2016-06-22 NOTE — Telephone Encounter (Signed)
Patient does not want to have to come back in as she would have to pay another copay. She states she was diagnosed with Bronchitis in April of 2016 by Dr. Althea CharonKaramalegos and he initially prescribed Z-pak as well. Z-pak did not work at that time either and patient was then prescribed levofloxacin and this worked. Patient requested that we ask Dr. Randolm IdolFletke to prescribe levofloxacin. Please advise.

## 2016-06-22 NOTE — Telephone Encounter (Signed)
Pt called because she has prescribed a Z-pak. She is not any better and Z- Pak don't work for her. She said that in the past she was given something else to help get better. Can we give her something else. jw

## 2016-06-22 NOTE — Telephone Encounter (Signed)
If patient is not getting better, would recommend she return to clinic to be seen. Please schedule her for same day appointment.

## 2016-06-22 NOTE — Telephone Encounter (Signed)
Spoke with Dr. Nancy MarusMayo regarding treatment options. Will forward to her to complete note.

## 2016-06-22 NOTE — Telephone Encounter (Signed)
Discussed treatment options with Dr. Randolm IdolFletke, including steroid burst and Albuterol inhaler. We would both prefer for Pt to be seen by a doctor, rather than prescribe medications over the phone. However, it sounds like Pt cannot afford her co-pay at this time. I spoke with Alexa Hess over the phone and she does not want to try a steroid pack. She doesn't understand why we won't give her Levaquin because she had no problem getting it last year and it worked for her. Discussed again with Dr. Randolm IdolFletke. Will give Levaquin 500mg  x 5 days, although suspect that this is likely a viral illness that will improve with symptomatic care.  Alexa CarolKaty Mayo, MD PGY-1

## 2016-07-17 ENCOUNTER — Other Ambulatory Visit: Payer: Self-pay | Admitting: *Deleted

## 2016-07-17 DIAGNOSIS — R102 Pelvic and perineal pain: Secondary | ICD-10-CM

## 2016-07-18 MED ORDER — IBUPROFEN 800 MG PO TABS: 800.0000 mg | ORAL_TABLET | Freq: Three times a day (TID) | ORAL | 1 refills | Status: DC | PRN

## 2016-09-07 ENCOUNTER — Encounter: Payer: Self-pay | Admitting: Internal Medicine

## 2016-09-07 ENCOUNTER — Other Ambulatory Visit (HOSPITAL_COMMUNITY)
Admission: RE | Admit: 2016-09-07 | Discharge: 2016-09-07 | Disposition: A | Discharge status: Home / Self Care | Payer: 59 | Source: Ambulatory Visit | Attending: Family Medicine | Admitting: Family Medicine

## 2016-09-07 ENCOUNTER — Ambulatory Visit (INDEPENDENT_AMBULATORY_CARE_PROVIDER_SITE_OTHER): Payer: 59 | Admitting: Internal Medicine

## 2016-09-07 VITALS — BP 125/83 | HR 99 | Temp 99.1°F | Ht 68.0 in | Wt 192.0 lb

## 2016-09-07 DIAGNOSIS — N760 Acute vaginitis: Secondary | ICD-10-CM

## 2016-09-07 DIAGNOSIS — R350 Frequency of micturition: Secondary | ICD-10-CM | POA: Diagnosis not present

## 2016-09-07 DIAGNOSIS — N898 Other specified noninflammatory disorders of vagina: Secondary | ICD-10-CM

## 2016-09-07 DIAGNOSIS — Z113 Encounter for screening for infections with a predominantly sexual mode of transmission: Secondary | ICD-10-CM | POA: Insufficient documentation

## 2016-09-07 LAB — POCT UA - MICROSCOPIC ONLY

## 2016-09-07 LAB — POCT WET PREP (WET MOUNT)
CLUE CELLS WET PREP WHIFF POC: NEGATIVE
TRICHOMONAS WET PREP HPF POC: ABSENT

## 2016-09-07 LAB — POCT URINALYSIS DIPSTICK
BILIRUBIN UA: NEGATIVE
GLUCOSE UA: NEGATIVE
Ketones, UA: NEGATIVE
Leukocytes, UA: NEGATIVE
NITRITE UA: NEGATIVE
Protein, UA: NEGATIVE
Spec Grav, UA: <=1.005
Urobilinogen, UA: 1
pH, UA: 6.5

## 2016-09-07 MED ORDER — METRONIDAZOLE 500 MG PO TABS: 500.0000 mg | ORAL_TABLET | Freq: Two times a day (BID) | ORAL | 0 refills | Status: DC

## 2016-09-07 MED ORDER — FLUCONAZOLE 150 MG PO TABS: 150.0000 mg | ORAL_TABLET | Freq: Once | ORAL | 0 refills | Status: AC

## 2016-09-07 NOTE — Patient Instructions (Signed)
Ms. Creig HinesWestbrook,  I will call you with your results.  I would recommend repeat pelvic ultrasound to follow-up ovarian cyst. Please discuss with your PCP at next visit.  Best, Dr. Sampson GoonFitzgerald

## 2016-09-07 NOTE — Progress Notes (Signed)
[facility]  Subjective:  Alexa Hess is a 45-y/o female who presents for pelvic pain and vaginal discharge. Has been ongoing for the past few weeks. She has noticed more discharge and is concerned about BV, as she has a history of recurrent episodes. Has not had sex for several months and says last partner had been tested for STDs and said tests were negative. She also reports lower abdominal cramping L > R. Says she has a history of ovarian cysts and kidney stones. Has had some back pain. Last pelvic U/S 03/2010 showed normal ovaries with follicles present on R. CT pelvis 01/2010 showed mild left hydronephrosis with calculus in left UVJ/urinary bladder wall; and left ovarian cysts with recommended follow-up in 6 weeks. Does report some sensation of pressure with urination and urgency but no dysuria. Has used metrogel within the last week.  ROS: No n/v/d, no fever  No Known Allergies  Objective: Blood pressure 125/83, pulse 99, temperature 99.1 F (37.3 C), temperature source Oral, height 5\' 8"  (1.727 m), weight 192 lb (87.1 kg), last menstrual period 08/12/2016.  Body mass index is 29.19 kg/m.  Constitutional: Overweight female, in NAD Cardiovascular: RRR, S1, S2, no m/r/g.  Pulmonary/Chest: Effort normal and breath sounds normal. No respiratory distress.  Abdominal: Soft. +BS, mildly TTP over lower abdomen, ND, no rebound or guarding.  Musculoskeletal: No CVA tenderness GU: Moderate amount of thin, greenish discharge seen on speculum exam. Cervix retroverted. No lesions noted. No cervical motion tenderness on bimanual exam.  Vitals reviewed  Assessment/Plan: Vaginal discharge - With abdominal pain. Wet prep negative but given unlikely exposure to STDs, history of recurrent BV and recent use of metrogel, still suspect symptoms are from BV. UA with trace blood but 0-3 RBC on microscopic add-on and negative for nitrites and leukocytes, making  pyelonephritis and UTI unlikely. GC/chlamydia pending. - Prescribed flagyl x 7 days - Did not want to schedule pelvic U/S today for history of ovarian cysts but would like to discuss with PCP at next visit  Follow-up if symptoms do not improve.  Dani GobbleHillary Saidee Geremia, MD [facility], PGY-2

## 2016-09-09 NOTE — Assessment & Plan Note (Signed)
-   With abdominal pain. Wet prep negative but given unlikely exposure to STDs, history of recurrent BV and recent use of metrogel, still suspect symptoms are from BV. UA with trace blood but 0-3 RBC on microscopic add-on and negative for nitrites and leukocytes, making pyelonephritis and UTI unlikely. GC/chlamydia pending. - Prescribed flagyl x 7 days - Did not want to schedule pelvic U/S today for history of ovarian cysts but would like to discuss with PCP at next visit

## 2016-09-10 LAB — CERVICOVAGINAL ANCILLARY ONLY
Chlamydia: NEGATIVE
NEISSERIA GONORRHEA: NEGATIVE

## 2016-10-29 ENCOUNTER — Ambulatory Visit (INDEPENDENT_AMBULATORY_CARE_PROVIDER_SITE_OTHER): Payer: 59 | Admitting: Family Medicine

## 2016-10-29 ENCOUNTER — Other Ambulatory Visit (HOSPITAL_COMMUNITY)
Admission: RE | Admit: 2016-10-29 | Discharge: 2016-10-29 | Disposition: A | Discharge status: Home / Self Care | Payer: 59 | Source: Ambulatory Visit | Attending: Family Medicine | Admitting: Family Medicine

## 2016-10-29 VITALS — BP 135/83 | HR 93 | Temp 98.7°F | Ht 68.0 in | Wt 191.0 lb

## 2016-10-29 DIAGNOSIS — Z113 Encounter for screening for infections with a predominantly sexual mode of transmission: Secondary | ICD-10-CM | POA: Insufficient documentation

## 2016-10-29 DIAGNOSIS — R300 Dysuria: Secondary | ICD-10-CM

## 2016-10-29 DIAGNOSIS — H9201 Otalgia, right ear: Secondary | ICD-10-CM | POA: Diagnosis not present

## 2016-10-29 DIAGNOSIS — G8929 Other chronic pain: Secondary | ICD-10-CM | POA: Insufficient documentation

## 2016-10-29 DIAGNOSIS — N898 Other specified noninflammatory disorders of vagina: Secondary | ICD-10-CM

## 2016-10-29 LAB — POCT UA - MICROSCOPIC ONLY

## 2016-10-29 LAB — POCT WET PREP (WET MOUNT)
CLUE CELLS WET PREP WHIFF POC: NEGATIVE
TRICHOMONAS WET PREP HPF POC: ABSENT

## 2016-10-29 LAB — POCT URINALYSIS DIPSTICK
Bilirubin, UA: NEGATIVE
Glucose, UA: NEGATIVE
LEUKOCYTES UA: NEGATIVE
NITRITE UA: NEGATIVE
PH UA: 5.5
PROTEIN UA: NEGATIVE
Spec Grav, UA: 1.025
UROBILINOGEN UA: 1

## 2016-10-29 MED ORDER — FLUCONAZOLE 150 MG PO TABS: 150.0000 mg | ORAL_TABLET | Freq: Once | ORAL | 0 refills | Status: AC

## 2016-10-29 NOTE — Assessment & Plan Note (Signed)
Patient is here with persistent chronic right-sided ear pain/fullness. Etiology unknown at this time. Physical exam did not yield any evidence of infectious sources or structural abnormalities. - Referral to ENT for further evaluation.

## 2016-10-29 NOTE — Patient Instructions (Addendum)
It was a pleasure seeing you today in our clinic. Today we discussed your vaginal discharge and ear pain. Here is the treatment plan we have discussed and agreed upon together:   - According to the tests that we did today believe you have a recent infection. I prescribed you Diflucan. Take 1 tablet and your symptoms should resolve over the next couple days. - I placed a referral to ENT. You'll be contacted to set up an appointment later this week.

## 2016-10-29 NOTE — Assessment & Plan Note (Addendum)
Patient here for vaginal discharge. Physical exam yielded thick curd-like discharge consistent with vaginal candidiasis. No evidence of clue cells present. - Diflucan provided  Of note: Patient stated that she had previously requested a change in her PCP as she had wanted a female provider. I instructed her on the necessary actions admitted to be taken to have this done and the fact that this request could easily be accommodated.

## 2016-10-29 NOTE — Progress Notes (Signed)
HPI  CC: Right ear pain and vaginal discharge Rt ear pain and hearing deficit. Has had issues for >6415yr in this ear. Seemed to get acutely worse 2 weeks ago. Always feels "full". Takes advil cold and sinus for this -- some benefit but rarely lasts more than a few hours. Some days seem to be worse than others.  No injury or trauma. No persistent rhinorrhea, sore throat, or watery eyes. No throat swelling or wheezing. No fever or chills. No dysphagia.  VAGINAL DISCHARGE Intercourse about a month ago. Since that time has had some discharge.   Having vaginal discharge for ~1 month. Discharge is: thick, yellow-ish Sex in last month: yes Using barrier protection (condoms): yes, but thinks there might be some "exposure" that occured Possible STD exposure: unsure Personal history of vaginal infection: no; but yes to h/o BV Family history of uterine or vaginal cancer: no Recent antibiotic use: no  Symptoms Vaginal itching: yes Dysuria: no Dyspareunia: yes Genital sores or ulcers: no Hematuria: no Flank pain: yes Weight loss: no Weight gain: no Trouble with vision: no Headaches: no Abdomen or pelvic pain: no Back pain: no   ROS see HPI Smoking Status noted  CC, SH/smoking status, and VS noted  Objective: BP 135/83 (BP Location: Left Arm, Patient Position: Sitting, Cuff Size: Normal)   Pulse 93   Temp 98.7 F (37.1 C) (Oral)   Ht 5\' 8"  (1.727 m)   Wt 191 lb (86.6 kg)   LMP 10/06/2016 (Exact Date)   SpO2 100%   BMI 29.04 kg/m  Gen: NAD, alert, cooperative, and pleasant. HEENT: NCAT, EOMI, PERRL, TMs clear bilaterally with normal cone reflex, MMM, no LAD, OP clear without exudate. CV: RRR, no murmur Resp: CTAB, no wheezes, non-labored Female genitalia: Vulva: normal appearing vulva with no masses, tenderness or lesions Vagina: Normal-appearing with no erythema or lesions Cervix: cervical discharge present - white, curd-like and thick, cervical motion tenderness absent and  lesions absent Neuro: Alert and oriented, Speech clear, No gross deficits [Shelly (nurse) present for genital exam.]  Assessment and plan:  Chronic right ear pain Patient is here with persistent chronic right-sided ear pain/fullness. Etiology unknown at this time. Physical exam did not yield any evidence of infectious sources or structural abnormalities. - Referral to ENT for further evaluation.  Vaginal discharge Patient here for vaginal discharge. Physical exam yielded thick curd-like discharge consistent with vaginal candidiasis. No evidence of clue cells present. - Diflucan provided  Of note: Patient stated that she had previously requested a change in her PCP as she had wanted a female provider. I instructed her on the necessary actions admitted to be taken to have this done and the fact that this request could easily be accommodated.   Orders Placed This Encounter  Procedures  . Ambulatory referral to ENT    Referral Priority:   Routine    Referral Type:   Consultation    Referral Reason:   Specialty Services Required    Requested Specialty:   Otolaryngology    Number of Visits Requested:   1  . POCT urinalysis dipstick  . POCT UA - Microscopic Only  . POCT Wet Prep [facility]([place])    Meds ordered this encounter  Medications  . fluconazole (DIFLUCAN) 150 MG tablet    Sig: Take 1 tablet (150 mg total) by mouth once.    Dispense:  1 tablet    Refill:  0     Kathee DeltonIan D Avien Taha, MD,MS,  PGY3 10/29/2016 7:10  PM   

## 2016-10-30 LAB — CERVICOVAGINAL ANCILLARY ONLY
Chlamydia: NEGATIVE
NEISSERIA GONORRHEA: NEGATIVE

## 2016-10-31 ENCOUNTER — Ambulatory Visit: Payer: 59 | Admitting: Family Medicine

## 2016-11-01 ENCOUNTER — Telehealth: Payer: Self-pay | Admitting: Family Medicine

## 2016-11-01 NOTE — Telephone Encounter (Signed)
Would like test results. °

## 2016-11-01 NOTE — Telephone Encounter (Signed)
Patient informed of normal G/C results.

## 2016-11-12 ENCOUNTER — Ambulatory Visit (INDEPENDENT_AMBULATORY_CARE_PROVIDER_SITE_OTHER): Payer: 59 | Admitting: Family Medicine

## 2016-11-12 ENCOUNTER — Encounter: Payer: Self-pay | Admitting: Family Medicine

## 2016-11-12 VITALS — BP 134/80 | HR 87 | Temp 98.6°F | Ht 68.0 in | Wt 194.0 lb

## 2016-11-12 DIAGNOSIS — Z23 Encounter for immunization: Secondary | ICD-10-CM

## 2016-11-12 DIAGNOSIS — Z01419 Encounter for gynecological examination (general) (routine) without abnormal findings: Secondary | ICD-10-CM

## 2016-11-12 LAB — POCT URINE PREGNANCY: PREG TEST UR: NEGATIVE

## 2016-11-12 NOTE — Patient Instructions (Addendum)
Ask at the front if you can get a female primary care doctor. Schedule an appointment with that new doctor to follow up on all the issues you've been having  Schedule an appointment with an OB/GYN to follow up on the ovarian cyst, pelvic pain, and vaginal discharge. Let us know if you need a referral.  Work on quitting smoking - this is the most important thing you can do to lower your health risks.  Be well, Dr. Ardelia Mems   Health Maintenance, Female Introduction Adopting a healthy lifestyle and getting preventive care can go a long way to promote health and wellness. Talk with your health care provider about what schedule of regular examinations is right for you. This is a good chance for you to check in with your provider about disease prevention and staying healthy. In between checkups, there are plenty of things you can do on your own. Experts have done a lot of research about which lifestyle changes and preventive measures are most likely to keep you healthy. Ask your health care provider for more information. Weight and diet Eat a healthy diet  Be sure to include plenty of vegetables, fruits, low-fat dairy products, and lean protein.  Do not eat a lot of foods high in solid fats, added sugars, or salt.  Get regular exercise. This is one of the most important things you can do for your health.  Most adults should exercise for at least 150 minutes each week. The exercise should increase your heart rate and make you sweat (moderate-intensity exercise).  Most adults should also do strengthening exercises at least twice a week. This is in addition to the moderate-intensity exercise. Maintain a healthy weight  Body mass index (BMI) is a measurement that can be used to identify possible weight problems. It estimates body fat based on height and weight. Your health care provider can help determine your BMI and help you achieve or maintain a healthy weight.  For females 6 years of age and  older:  A BMI below 18.5 is considered underweight.  A BMI of 18.5 to 24.9 is normal.  A BMI of 25 to 29.9 is considered overweight.  A BMI of 30 and above is considered obese. Watch levels of cholesterol and blood lipids  You should start having your blood tested for lipids and cholesterol at 44 years of age, then have this test every 5 years.  You may need to have your cholesterol levels checked more often if:  Your lipid or cholesterol levels are high.  You are older than 44 years of age.  You are at high risk for heart disease. Cancer screening Lung Cancer  Lung cancer screening is recommended for adults 15-48 years old who are at high risk for lung cancer because of a history of smoking.  A yearly low-dose CT scan of the lungs is recommended for people who:  Currently smoke.  Have quit within the past 15 years.  Have at least a 30-pack-year history of smoking. A pack year is smoking an average of one pack of cigarettes a day for 1 year.  Yearly screening should continue until it has been 15 years since you quit.  Yearly screening should stop if you develop a health problem that would prevent you from having lung cancer treatment. Breast Cancer  Practice breast self-awareness. This means understanding how your breasts normally appear and feel.  It also means doing regular breast self-exams. Let your health care provider know about any changes, no matter  how small.  If you are in your 20s or 30s, you should have a clinical breast exam (CBE) by a health care provider every 1-3 years as part of a regular health exam.  If you are 83 or older, have a CBE every year. Also consider having a breast X-ray (mammogram) every year.  If you have a family history of breast cancer, talk to your health care provider about genetic screening.  If you are at high risk for breast cancer, talk to your health care provider about having an MRI and a mammogram every year.  Breast cancer  gene (BRCA) assessment is recommended for women who have family members with BRCA-related cancers. BRCA-related cancers include:  Breast.  Ovarian.  Tubal.  Peritoneal cancers.  Results of the assessment will determine the need for genetic counseling and BRCA1 and BRCA2 testing. Cervical Cancer  Your health care provider may recommend that you be screened regularly for cancer of the pelvic organs (ovaries, uterus, and vagina). This screening involves a pelvic examination, including checking for microscopic changes to the surface of your cervix (Pap test). You may be encouraged to have this screening done every 3 years, beginning at age 80.  For women ages 23-65, health care providers may recommend pelvic exams and Pap testing every 3 years, or they may recommend the Pap and pelvic exam, combined with testing for human papilloma virus (HPV), every 5 years. Some types of HPV increase your risk of cervical cancer. Testing for HPV may also be done on women of any age with unclear Pap test results.  Other health care providers may not recommend any screening for nonpregnant women who are considered low risk for pelvic cancer and who do not have symptoms. Ask your health care provider if a screening pelvic exam is right for you.  If you have had past treatment for cervical cancer or a condition that could lead to cancer, you need Pap tests and screening for cancer for at least 20 years after your treatment. If Pap tests have been discontinued, your risk factors (such as having a new sexual partner) need to be reassessed to determine if screening should resume. Some women have medical problems that increase the chance of getting cervical cancer. In these cases, your health care provider may recommend more frequent screening and Pap tests. Colorectal Cancer  This type of cancer can be detected and often prevented.  Routine colorectal cancer screening usually begins at 44 years of age and continues  through 44 years of age.  Your health care provider may recommend screening at an earlier age if you have risk factors for colon cancer.  Your health care provider may also recommend using home test kits to check for hidden blood in the stool.  A small camera at the end of a tube can be used to examine your colon directly (sigmoidoscopy or colonoscopy). This is done to check for the earliest forms of colorectal cancer.  Routine screening usually begins at age 23.  Direct examination of the colon should be repeated every 5-10 years through 44 years of age. However, you may need to be screened more often if early forms of precancerous polyps or small growths are found. Skin Cancer  Check your skin from head to toe regularly.  Tell your health care provider about any new moles or changes in moles, especially if there is a change in a mole's shape or color.  Also tell your health care provider if you have a mole that  is larger than the size of a pencil eraser.  Always use sunscreen. Apply sunscreen liberally and repeatedly throughout the day.  Protect yourself by wearing long sleeves, pants, a wide-brimmed hat, and sunglasses whenever you are outside. Heart disease, diabetes, and high blood pressure  High blood pressure causes heart disease and increases the risk of stroke. High blood pressure is more likely to develop in:  People who have blood pressure in the high end of the normal range (130-139/85-89 mm Hg).  People who are overweight or obese.  People who are African American.  If you are 66-31 years of age, have your blood pressure checked every 3-5 years. If you are 29 years of age or older, have your blood pressure checked every year. You should have your blood pressure measured twice-once when you are at a hospital or clinic, and once when you are not at a hospital or clinic. Record the average of the two measurements. To check your blood pressure when you are not at a hospital  or clinic, you can use:  An automated blood pressure machine at a pharmacy.  A home blood pressure monitor.  If you are between 86 years and 27 years old, ask your health care provider if you should take aspirin to prevent strokes.  Have regular diabetes screenings. This involves taking a blood sample to check your fasting blood sugar level.  If you are at a normal weight and have a low risk for diabetes, have this test once every three years after 44 years of age.  If you are overweight and have a high risk for diabetes, consider being tested at a younger age or more often. Preventing infection Hepatitis B  If you have a higher risk for hepatitis B, you should be screened for this virus. You are considered at high risk for hepatitis B if:  You were born in a country where hepatitis B is common. Ask your health care provider which countries are considered high risk.  Your parents were born in a high-risk country, and you have not been immunized against hepatitis B (hepatitis B vaccine).  You have HIV or AIDS.  You use needles to inject street drugs.  You live with someone who has hepatitis B.  You have had sex with someone who has hepatitis B.  You get hemodialysis treatment.  You take certain medicines for conditions, including cancer, organ transplantation, and autoimmune conditions. Hepatitis C  Blood testing is recommended for:  Everyone born from [birth year] through 1964.  Anyone with known risk factors for hepatitis C. Sexually transmitted infections (STIs)  You should be screened for sexually transmitted infections (STIs) including gonorrhea and chlamydia if:  You are sexually active and are younger than 44 years of age.  You are older than 45 years of age and your health care provider tells you that you are at risk for this type of infection.  Your sexual activity has changed since you were last screened and you are at an increased risk for chlamydia or gonorrhea. Ask  your health care provider if you are at risk.  If you do not have HIV, but are at risk, it may be recommended that you take a prescription medicine daily to prevent HIV infection. This is called pre-exposure prophylaxis (PrEP). You are considered at risk if:  You are sexually active and do not regularly use condoms or know the HIV status of your partner(s).  You take drugs by injection.  You are sexually active with a  partner who has HIV. Talk with your health care provider about whether you are at high risk of being infected with HIV. If you choose to begin PrEP, you should first be tested for HIV. You should then be tested every 3 months for as long as you are taking PrEP. Pregnancy  If you are premenopausal and you may become pregnant, ask your health care provider about preconception counseling.  If you may become pregnant, take 400 to 800 micrograms (mcg) of folic acid every day.  If you want to prevent pregnancy, talk to your health care provider about birth control (contraception). Osteoporosis and menopause  Osteoporosis is a disease in which the bones lose minerals and strength with aging. This can result in serious bone fractures. Your risk for osteoporosis can be identified using a bone density scan.  If you are 20 years of age or older, or if you are at risk for osteoporosis and fractures, ask your health care provider if you should be screened.  Ask your health care provider whether you should take a calcium or vitamin D supplement to lower your risk for osteoporosis.  Menopause may have certain physical symptoms and risks.  Hormone replacement therapy may reduce some of these symptoms and risks. Talk to your health care provider about whether hormone replacement therapy is right for you. Follow these instructions at home:  Schedule regular health, dental, and eye exams.  Stay current with your immunizations.  Do not use any tobacco products including cigarettes,  chewing tobacco, or electronic cigarettes.  If you are pregnant, do not drink alcohol.  If you are breastfeeding, limit how much and how often you drink alcohol.  Limit alcohol intake to no more than 1 drink per day for nonpregnant women. One drink equals 12 ounces of beer, 5 ounces of wine, or 1 ounces of hard liquor.  Do not use street drugs.  Do not share needles.  Ask your health care provider for help if you need support or information about quitting drugs.  Tell your health care provider if you often feel depressed.  Tell your health care provider if you have ever been abused or do not feel safe at home. This information is not intended to replace advice given to you by your health care provider. Make sure you discuss any questions you have with your health care provider. Document Released: 06/25/2011 Document Revised: 05/17/2016 Document Reviewed: 09/13/2015  2017 Elsevier

## 2016-11-12 NOTE — Progress Notes (Signed)
Date of Visit: 11/12/2016   HPI:  Patient presents today for a well woman exam.   Concerns today: none identified by patient Periods: every month, no intermenstrual bleeding, LMP last week Contraception: using condoms, not interested in other forms of birth control Pelvic symptoms: vaginal discharge, pelvic pain, just seen yesterday at urgent care for this, awaiting test results. Also has plans to establish with OB/GYN to follow up further. Does not want to address this today. Sexual activity: partners 1 female STD Screening: just got tested Pap smear status: UTD Exercise: walks 3 times per week Diet: tries to eat healthy Smoking: half pack of cigarettes every 2 days Alcohol: occasional (monthly or less) Drugs: none currently Mood: reports history of anxiety. No thoughts of self harm. Does not want to address this today. Dentist: has not been in a while  ROS: ROS sheet completed by patient is widely positive, but she did not want to discuss these issues today (reports she is just here for a wellness preventive visit). Items that were positive listed below, including follow up questions I asked: -Trouble seeing, hearing, and ringing in ears -Chest pain, fast/irregular heartbeat - 3-4 weeks ago had episode of chest pain that lasted 10 minutes, then subsided spontaneously. Has had some pressure in left arm. No chest pain or shortness of breath with exertion. No history of cardiac disease. Mom had some cardiac issues but also had sickle cell disease. Patient walks 3 times a week without any chest pain or shortness of breath.  -Abdominal pain - reports being seen for this before, same as in past, does not want to discuss today -Blood with urination, frequent urination, difficulty starting/keeping stream, vaginal discharge  - reports these have been ongoing for a while, [facility], plans to follow up with OB/GYN to discuss vaginal discharge  further -Muscle cramps or aches -Headaches, numbness, dizziness -Anxiety/stress - denies SI/HI -Excessive thirst, frequent urination  PMFSH:  Hyperlipidemia, tobacco abuse, GERD, hemoglobin C disease Per chart review no FHx of breast cancer  PHYSICAL EXAM: BP 134/80   Pulse 87   Temp 98.6 F (37 C) (Oral)   Ht 5\' 8"  (1.727 m)   Wt 194 lb (88 kg)   LMP 11/05/2016   BMI 29.50 kg/m  Gen: NAD, pleasant, cooperative HEENT: NCAT, PERRL, no palpable thyromegaly or anterior cervical lymphadenopathy Heart: RRR, no murmurs Lungs: CTAB, NWOB Abdomen: soft, nontender to palpation Neuro: grossly nonfocal, speech normal   ASSESSMENT/PLAN:  Health maintenance:  -STD screening: just had done at another clinic -pap smear: UTD -mammogram: plan start at age 45 -lipid screening: declined labwork today -immunizations: declines flu vaccine. Tdap given today. -counseled on importance of smoking cessation -handout given on health maintenance topics  Widely positive ROS - chart review shows she last saw PCP on 11/11/15 for a physical, and since then has been seen four separate times by four separate providers for acute complaints. I reviewed the ROS sheet with her, and determined that no issues require immediate or urgent evaluation (chest pain was self limited, lasted only 10 mins, no chest pain on exertion, very atypical). Did obtain urine pregnancy test due to mention of pelvic pain, which was negative.  Encouraged her to schedule appointment with PCP to follow up on these issues, rather than coming in for SDA with different providers. Patient reports she would like a female PCP - advised she can inquire about this at the front desk.  FOLLOW  UP: Follow up in the next month with PCP for widely positive review of systems.  GrenadaBrittany J. Pollie MeyerMcIntyre, MD [facility]

## 2017-02-13 LAB — RESULTS CONSOLE HPV: CHL HPV: NEGATIVE

## 2017-04-25 ENCOUNTER — Other Ambulatory Visit (HOSPITAL_COMMUNITY)
Admission: RE | Admit: 2017-04-25 | Discharge: 2017-04-25 | Disposition: A | Discharge status: Home / Self Care | Payer: 59 | Source: Ambulatory Visit | Attending: Family Medicine | Admitting: Family Medicine

## 2017-04-25 ENCOUNTER — Ambulatory Visit (INDEPENDENT_AMBULATORY_CARE_PROVIDER_SITE_OTHER): Payer: 59 | Admitting: Family Medicine

## 2017-04-25 VITALS — BP 118/80 | HR 95 | Temp 98.2°F | Ht 68.0 in | Wt 207.8 lb

## 2017-04-25 DIAGNOSIS — Z113 Encounter for screening for infections with a predominantly sexual mode of transmission: Secondary | ICD-10-CM | POA: Diagnosis not present

## 2017-04-25 DIAGNOSIS — N898 Other specified noninflammatory disorders of vagina: Secondary | ICD-10-CM | POA: Diagnosis not present

## 2017-04-25 DIAGNOSIS — N912 Amenorrhea, unspecified: Secondary | ICD-10-CM | POA: Diagnosis not present

## 2017-04-25 LAB — POCT WET PREP (WET MOUNT)
CLUE CELLS WET PREP WHIFF POC: POSITIVE
Trichomonas Wet Prep HPF POC: ABSENT

## 2017-04-25 LAB — POCT URINE PREGNANCY: PREG TEST UR: NEGATIVE

## 2017-04-25 NOTE — Progress Notes (Signed)
   Subjective:   Alexa Hess is a 46 y.o. female with a history of GERD, recurrent vaginal discharge, dyslipidemia here for vaginal discharge  She reports intermittent white, thin, malodorous vaginal discharge since 01/2017. She tried taking apple cider vinegar because she felt this is bacterial vaginosis, but it did not help. She reports she has had several episodes of bacterial vaginosis in the past and this seems similar. She also insists that she needs a Pap smear. Reassured that her last Pap smear was normal in 2015 and she does not need a Pap smear until 2020, she insists that because of her ovarian cysts she needs a Pap smear. She is sexually active with one female partner and does desire STD testing today. She states that her periods and irregular and her last was in February and she would like a pregnancy test today.  Review of Systems:  Per HPI.   Social History: Current smoker  Objective:  BP 118/80 (BP Location: Left Arm, Patient Position: Sitting, Cuff Size: Normal)   Pulse 95   Temp 98.2 F (36.8 C) (Oral)   Ht 5\' 8"  (1.727 Hess)   Wt 207 lb 12.8 oz (94.3 kg)   SpO2 99%   BMI 31.60 kg/Hess   Gen:  46 y.o. female in NAD HEENT: NCAT, MMM CV: Regular rate Resp: Non-labored Abd: Soft, NTND, BS present, no guarding or organomegaly GYN:  External genitalia within normal limits.  Vaginal mucosa pink, moist, normal rugae.  Nonfriable cervix without lesions, thin white discharge, no bleeding noted on speculum exam.  Bimanual exam revealed slightly enlarged uterus (likely due to fibroid).  No cervical motion tenderness. No adnexal masses bilaterally.   Ext: WWP, no edema MSK: No obvious deformities, gait intact Neuro: Alert and oriented, speech normal     Assessment & Plan:     Alexa Hess is a 46 y.o. female here for   Vaginal discharge Wet prep reveals clue cells consistent with bacterial vaginosis Treat with Flagyl  Routine screening for STI (sexually  transmitted infection) Check GC chlamydia Offered HIV, RPR testing but patient refused blood draw  Amenorrhea Urine pregnancy test negative Patient may be perimenopausal versus have irregular periods at baseline Could follow-up with PCP for further workup   Erasmo DownerAngela Hess Bacigalupo, MD MPH PGY-3,  [facility] 04/26/2017  8:55 AM

## 2017-04-25 NOTE — Patient Instructions (Signed)
Vaginitis Vaginitis is an inflammation of the vagina. It is most often caused by a change in the normal balance of the bacteria and yeast that live in the vagina. This change in balance causes an overgrowth of certain bacteria or yeast, which causes the inflammation. There are different types of vaginitis, but the most common types are:  Bacterial vaginosis.  Yeast infection (candidiasis).  Trichomoniasis vaginitis. This is a sexually transmitted infection (STI).  Viral vaginitis.  Atrophic vaginitis.  Allergic vaginitis. What are the causes? The cause depends on the type of vaginitis. Vaginitis can be caused by:  Bacteria (bacterial vaginosis).  Yeast (yeast infection).  A parasite (trichomoniasis vaginitis)  A virus (viral vaginitis).  Low hormone levels (atrophic vaginitis). Low hormone levels can occur during pregnancy, breastfeeding, or after menopause.  Irritants, such as bubble baths, scented tampons, and feminine sprays (allergic vaginitis). Other factors can change the normal balance of the yeast and bacteria that live in the vagina. These include:  Antibiotic medicines.  Poor hygiene.  Diaphragms, vaginal sponges, spermicides, birth control pills, and intrauterine devices (IUD).  Sexual intercourse.  Infection.  Uncontrolled diabetes.  A weakened immune system. What are the signs or symptoms? Symptoms can vary depending on the cause of the vaginitis. Common symptoms include:  Abnormal vaginal discharge.  The discharge is white, gray, or yellow with bacterial vaginosis.  The discharge is thick, white, and cheesy with a yeast infection.  The discharge is frothy and yellow or greenish with trichomoniasis.  A bad vaginal odor.  The odor is fishy with bacterial vaginosis.  Vaginal itching, pain, or swelling.  Painful intercourse.  Pain or burning when urinating. Sometimes there are no symptoms. How is this treated? Treatment will vary depending on  the type of infection.  Bacterial vaginosis and trichomoniasis are often treated with antibiotic creams or pills.  Yeast infections are often treated with antifungal medicines, such as vaginal creams or suppositories.  Viral vaginitis has no cure, but symptoms can be treated with medicines that relieve discomfort. Your sexual partner should be treated as well.  Atrophic vaginitis may be treated with an estrogen cream, pill, suppository, or vaginal ring. If vaginal dryness occurs, lubricants and moisturizing creams may help. You may be told to avoid scented soaps, sprays, or douches.  Allergic vaginitis treatment involves quitting the use of the product that is causing the problem. Vaginal creams can be used to treat the symptoms. Follow these instructions at home:  Take all medicines as directed by your caregiver.  Keep your genital area clean and dry. Avoid soap and only rinse the area with water.  Avoid douching. It can remove the healthy bacteria in the vagina.  Do not use tampons or have sexual intercourse until your vaginitis has been treated. Use sanitary pads while you have vaginitis.  Wipe from front to back. This avoids the spread of bacteria from the rectum to the vagina.  Let air reach your genital area. ? Wear cotton underwear to decrease moisture buildup.  Avoid wearing underwear while you sleep until your vaginitis is gone.  Avoid tight pants and underwear or nylons without a cotton panel.  Take off wet clothing (especially bathing suits) as soon as possible.  Use mild, non-scented products. Avoid using irritants, such as:  Scented feminine sprays.  Fabric softeners.  Scented detergents.  Scented tampons.  Scented soaps or bubble baths.  Practice safe sex and use condoms. Condoms may prevent the spread of trichomoniasis and viral vaginitis. Contact a health care   provider if:  You have abdominal pain.  You have symptoms that last for more than 2-3  days.  You have a fever and your symptoms suddenly get worse. This information is not intended to replace advice given to you by your health care provider. Make sure you discuss any questions you have with your health care provider. Document Released: 10/07/2007 Document Revised: 10/31/2016 Document Reviewed: 10/31/2016 Elsevier Interactive Patient Education  2017 [employer]  

## 2017-04-26 MED ORDER — METRONIDAZOLE 500 MG PO TABS: 500.0000 mg | ORAL_TABLET | Freq: Two times a day (BID) | ORAL | 0 refills | Status: DC

## 2017-04-26 NOTE — Assessment & Plan Note (Signed)
Check GC chlamydia Offered HIV, RPR testing but patient refused blood draw

## 2017-04-26 NOTE — Assessment & Plan Note (Signed)
Urine pregnancy test negative Patient may be perimenopausal versus have irregular periods at baseline Could follow-up with PCP for further workup

## 2017-04-26 NOTE — Assessment & Plan Note (Signed)
Wet prep reveals clue cells consistent with bacterial vaginosis Treat with Flagyl

## 2017-04-29 LAB — GC/CHLAMYDIA PROBE AMP (~~LOC~~) NOT AT ARMC
Chlamydia: NEGATIVE
NEISSERIA GONORRHEA: NEGATIVE

## 2017-04-29 NOTE — Progress Notes (Signed)
Reported all to patient. She knew about BV and is using rx.

## 2017-05-26 DIAGNOSIS — H1032 Unspecified acute conjunctivitis, left eye: Secondary | ICD-10-CM | POA: Diagnosis not present

## 2017-05-26 DIAGNOSIS — H01004 Unspecified blepharitis left upper eyelid: Secondary | ICD-10-CM | POA: Diagnosis not present

## 2017-05-28 ENCOUNTER — Encounter: Payer: Self-pay | Admitting: Obstetrics and Gynecology

## 2017-05-28 ENCOUNTER — Ambulatory Visit (INDEPENDENT_AMBULATORY_CARE_PROVIDER_SITE_OTHER): Payer: 59 | Admitting: Obstetrics and Gynecology

## 2017-05-28 VITALS — BP 110/80 | HR 97 | Temp 99.1°F | Wt 211.0 lb

## 2017-05-28 DIAGNOSIS — H00024 Hordeolum internum left upper eyelid: Secondary | ICD-10-CM | POA: Diagnosis not present

## 2017-05-28 DIAGNOSIS — H578 Other specified disorders of eye and adnexa: Secondary | ICD-10-CM | POA: Diagnosis not present

## 2017-05-28 DIAGNOSIS — H02846 Edema of left eye, unspecified eyelid: Secondary | ICD-10-CM

## 2017-05-28 DIAGNOSIS — H5789 Other specified disorders of eye and adnexa: Secondary | ICD-10-CM

## 2017-05-28 MED ORDER — CEFTRIAXONE SODIUM 1 G IJ SOLR
1.0000 g | Freq: Once | INTRAMUSCULAR | Status: AC
  Administered 2017-05-28: 1 g via INTRAMUSCULAR

## 2017-05-28 NOTE — Progress Notes (Signed)
   Subjective:   Patient ID: Alexa Hess, female    DOB: 09-01-1971, 46 y.o.   MRN: [id number]  Patient presents for Same Day Appointment  Chief Complaint  Patient presents with  . Facial Swelling    left eye     HPI: # Left eye swelling Patient started having left eye swelling starting Friday. Upper eyelid progressively started to swell. She went to an urgent care on Sunday and was prescribed antibodies eyedrops. Was told to follow-up with ophthalmologist. Patient says I continued to swell and become painful. Start having swelling of her lower eyelid as well. Patient states that yesterday she started having pain with extraocular movements started getting a headache on the left side. She is also endorsing blurry vision and watery eyes. Denies any injury to eye. Denies any sick contacts with similar symptoms. Has never happened before. Patient does not wear glasses or contacts. States she's been having some low-grade fevers but has been well otherwise.   Review of Systems   See HPI for ROS.   History  Smoking Status  . Current Every Day Smoker  . Packs/day: 0.50  . Years: 10.00  . Types: Cigarettes  Smokeless Tobacco  . Never Used    Past medical history, surgical, family, and social history reviewed and updated in the EMR as appropriate.   Objective:  BP 110/80   Pulse 97   Temp 99.1 F (37.3 C) (Oral)   Wt 211 lb (95.7 kg)   LMP 05/14/2017   SpO2 98%   BMI 32.08 kg/m  Vitals and nursing note reviewed  Physical Exam  Constitutional: She is well-developed, well-nourished, and in no distress.  Eyes: Pupils are equal, round, and reactive to light. Left eye exhibits hordeolum. Left conjunctiva is injected. Left eye exhibits normal extraocular motion.  Left eye with eyelid swelling and erythema. Tender to palpation of eye with associated warmth. Pain with EOMI. Tearing of left eye. Hordeolum on upper eyelid with abscess formation.    Vision Screening  Edited by:  Henri MedalHartsell, Jazmin M, CMA   Right eye Left eye Both eyes  Without correction 20/40-1 20/50-2   Comments: Pt had a hard time seeing from her left eye due to it watering so much.    Assessment & Plan:  1. Eye swelling, left Concern for preseptal or orbital cellulitis with associated abscess formation. Patient with low grade temperatures, worsening of swelling and now with EOM pain and vision changes. Visiual acuity decreased. Called to discuss case with opthamologist Dr. Dione BoozeGroat who recommended patient come over to his clinic for evaluation. Patient given Rocephin in clinic for antibacterial coverage prior to leaving. Patient may need imaging but will leave to opthomologist.  - cefTRIAXone (ROCEPHIN) injection 1 g; Inject 1 g into the muscle once.  Case precepted with Dr. Gwendolyn GrantWalden  Diagnosis and plan along with any newly prescribed medication(s) were discussed in detail with this patient today. The patient verbalized understanding and agreed with the plan.    PATIENT EDUCATION PROVIDED: See AVS   Caryl AdaJazma Vinal Rosengrant, DO 05/28/2017, 10:27 AM PGY-3, [facility]

## 2017-05-28 NOTE — Patient Instructions (Addendum)
Please go to Dr. Alton Revereobert Groat's office after this visit  Address: [address], [place], [place] [id number]  Believe you have a cellulitis around your eye from a sty   Orbital Cellulitis Orbital cellulitis is an infection in the eye socket (orbit) and the tissues that surround the eye. The infection can spread to the eyelids, eyebrow area, and cheek. It can also cause a pocket of pus to develop around the eye (orbital abscess). In severe cases, the infection can spread to the brain. Orbital cellulitis is a medical emergency. What are the causes? The most common cause of this condition is a bacterial infection. The infection usually spreads to the eye socket from another part of the body. The infection may start in:  The nose or sinuses.  The eyelids.  Facial skin.  The bloodstream.  What increases the risk? This condition is more likely to develop in people who have recently had one of the following:  Upper respiratory infection.  Sinus infection.  Eyelid or facial infection.  Eye injury.  Infection that affects the entire body or the bloodstream (systemic infection).  What are the signs or symptoms? Symptoms of this condition usually start quickly. Symptoms include:  Eye pain that gets worse with eye movement.  Swelling around the eye.  Eye redness.  Bulging of the eye.  Inability to move the eye.  Double vision.  Fever.  How is this diagnosed? This condition may be diagnosed based on your symptoms and an eye exam. You may also have tests to confirm the diagnosis and to check for an orbital abscess. Other tests (cultures) may be done to find out what type of bacteria is causing the infection. Tests may include:  Complete blood count (CBC).  Blood culture.  Nose, sinus, or throat culture.  Imaging studies such as a CT scan or MRI.  How is this treated? This condition is usually treated in a hospital. Antibiotic medicines are given directly into a vein  through an IV tube.  At first, you may get IV antibiotics to kill bacteria that often cause orbital cellulitis (broad spectrum antibiotics).  Your medicine may be changed if cultures suggest that another antibiotic would be better.  If the IV antibiotics are working to treat your infection, you may be switched to oral antibiotics and allowed to go home.  In some cases, surgery may be needed to drain an orbital abscess.  Follow these instructions at home:  Take medicines only as directed by your health care provider.  Take your antibiotic medicine as directed by your health care provider. Finish the antibiotic even if you start to feel better.  Return to your normal activities as directed by your health care provider. Ask your health care provider what activities are safe for you.  Keep all follow-up visits as directed by your health care provider. This is important. Get help right away if:  Your eye pain or swelling returns or it gets worse.  You have any changes in your vision.  You have a fever. This information is not intended to replace advice given to you by your health care provider. Make sure you discuss any questions you have with your health care provider. Document Released: 12/04/2001 Document Revised: 05/17/2016 Document Reviewed: 12/06/2014 Elsevier Interactive Patient Education  [employer]2018 Else[employer] Inc.

## 2017-05-28 NOTE — Progress Notes (Deleted)
Started Friday after work Feeling funny Sinus also Saturday started getting bigger  Tried ice Went to urgent care andgiven polymyxin b-tmp drops since Sunday Couldn't get into eye doctor Head hurting now on left H/o migraines   Blurry and watery eyeballis painful and shooting pain  No trauma to eye And gooky No conttacts with similar symtpoms

## 2017-12-13 ENCOUNTER — Encounter: Payer: Self-pay | Admitting: Family Medicine

## 2017-12-13 ENCOUNTER — Other Ambulatory Visit (HOSPITAL_COMMUNITY)
Admission: RE | Admit: 2017-12-13 | Discharge: 2017-12-13 | Disposition: A | Discharge status: Home / Self Care | Payer: 59 | Source: Ambulatory Visit | Attending: Family Medicine | Admitting: Family Medicine

## 2017-12-13 ENCOUNTER — Ambulatory Visit (INDEPENDENT_AMBULATORY_CARE_PROVIDER_SITE_OTHER): Payer: 59 | Admitting: Family Medicine

## 2017-12-13 ENCOUNTER — Other Ambulatory Visit: Payer: Self-pay

## 2017-12-13 VITALS — BP 122/78 | HR 99 | Temp 98.6°F | Ht 68.0 in | Wt 220.0 lb

## 2017-12-13 DIAGNOSIS — Z1151 Encounter for screening for human papillomavirus (HPV): Secondary | ICD-10-CM | POA: Insufficient documentation

## 2017-12-13 DIAGNOSIS — N923 Ovulation bleeding: Secondary | ICD-10-CM | POA: Diagnosis not present

## 2017-12-13 DIAGNOSIS — Z124 Encounter for screening for malignant neoplasm of cervix: Secondary | ICD-10-CM | POA: Diagnosis not present

## 2017-12-13 DIAGNOSIS — N898 Other specified noninflammatory disorders of vagina: Secondary | ICD-10-CM

## 2017-12-13 LAB — POCT WET PREP (WET MOUNT)
Clue Cells Wet Prep Whiff POC: POSITIVE
Trichomonas Wet Prep HPF POC: ABSENT

## 2017-12-13 MED ORDER — METRONIDAZOLE 500 MG PO TABS: 500.0000 mg | ORAL_TABLET | Freq: Two times a day (BID) | ORAL | 0 refills | Status: DC

## 2017-12-13 NOTE — Patient Instructions (Addendum)
Will call or send letter with results  Schedule another appointment to talk about the chest pain/fluttering If you have any chest pain that does not go away within 30 minutes, is accompanied by nausea, sweating, shortness of breath, or made worse by activity, go to the emergency room immediately for evaluation.   Schedule an appointment with your eye doctor  We'll also talk more about your periods at that appointment  Be well, Dr. Ardelia Mems    Health Maintenance, Female Adopting a healthy lifestyle and getting preventive care can go a long way to promote health and wellness. Talk with your health care provider about what schedule of regular examinations is right for you. This is a good chance for you to check in with your provider about disease prevention and staying healthy. In between checkups, there are plenty of things you can do on your own. Experts have done a lot of research about which lifestyle changes and preventive measures are most likely to keep you healthy. Ask your health care provider for more information. Weight and diet Eat a healthy diet  Be sure to include plenty of vegetables, fruits, low-fat dairy products, and lean protein.  Do not eat a lot of foods high in solid fats, added sugars, or salt.  Get regular exercise. This is one of the most important things you can do for your health. ? Most adults should exercise for at least 150 minutes each week. The exercise should increase your heart rate and make you sweat (moderate-intensity exercise). ? Most adults should also do strengthening exercises at least twice a week. This is in addition to the moderate-intensity exercise.  Maintain a healthy weight  Body mass index (BMI) is a measurement that can be used to identify possible weight problems. It estimates body fat based on height and weight. Your health care provider can help determine your BMI and help you achieve or maintain a healthy weight.  For females 40 years  of age and older: ? A BMI below 18.5 is considered underweight. ? A BMI of 18.5 to 24.9 is normal. ? A BMI of 25 to 29.9 is considered overweight. ? A BMI of 30 and above is considered obese.  Watch levels of cholesterol and blood lipids  You should start having your blood tested for lipids and cholesterol at 45 years of age, then have this test every 5 years.  You may need to have your cholesterol levels checked more often if: ? Your lipid or cholesterol levels are high. ? You are older than 45 years of age. ? You are at high risk for heart disease.  Cancer screening Lung Cancer  Lung cancer screening is recommended for adults 43-33 years old who are at high risk for lung cancer because of a history of smoking.  A yearly low-dose CT scan of the lungs is recommended for people who: ? Currently smoke. ? Have quit within the past 15 years. ? Have at least a 30-pack-year history of smoking. A pack year is smoking an average of one pack of cigarettes a day for 1 year.  Yearly screening should continue until it has been 15 years since you quit.  Yearly screening should stop if you develop a health problem that would prevent you from having lung cancer treatment.  Breast Cancer  Practice breast self-awareness. This means understanding how your breasts normally appear and feel.  It also means doing regular breast self-exams. Let your health care provider know about any changes, no matter how  small.  If you are in your 20s or 30s, you should have a clinical breast exam (CBE) by a health care provider every 1-3 years as part of a regular health exam.  If you are 40 or older, have a CBE every year. Also consider having a breast X-ray (mammogram) every year.  If you have a family history of breast cancer, talk to your health care provider about genetic screening.  If you are at high risk for breast cancer, talk to your health care provider about having an MRI and a mammogram every  year.  Breast cancer gene (BRCA) assessment is recommended for women who have family members with BRCA-related cancers. BRCA-related cancers include: ? Breast. ? Ovarian. ? Tubal. ? Peritoneal cancers.  Results of the assessment will determine the need for genetic counseling and BRCA1 and BRCA2 testing.  Cervical Cancer Your health care provider may recommend that you be screened regularly for cancer of the pelvic organs (ovaries, uterus, and vagina). This screening involves a pelvic examination, including checking for microscopic changes to the surface of your cervix (Pap test). You may be encouraged to have this screening done every 3 years, beginning at age 59.  For women ages 10-65, health care providers may recommend pelvic exams and Pap testing every 3 years, or they may recommend the Pap and pelvic exam, combined with testing for human papilloma virus (HPV), every 5 years. Some types of HPV increase your risk of cervical cancer. Testing for HPV may also be done on women of any age with unclear Pap test results.  Other health care providers may not recommend any screening for nonpregnant women who are considered low risk for pelvic cancer and who do not have symptoms. Ask your health care provider if a screening pelvic exam is right for you.  If you have had past treatment for cervical cancer or a condition that could lead to cancer, you need Pap tests and screening for cancer for at least 20 years after your treatment. If Pap tests have been discontinued, your risk factors (such as having a new sexual partner) need to be reassessed to determine if screening should resume. Some women have medical problems that increase the chance of getting cervical cancer. In these cases, your health care provider may recommend more frequent screening and Pap tests.  Colorectal Cancer  This type of cancer can be detected and often prevented.  Routine colorectal cancer screening usually begins at 45  years of age and continues through 45 years of age.  Your health care provider may recommend screening at an earlier age if you have risk factors for colon cancer.  Your health care provider may also recommend using home test kits to check for hidden blood in the stool.  A small camera at the end of a tube can be used to examine your colon directly (sigmoidoscopy or colonoscopy). This is done to check for the earliest forms of colorectal cancer.  Routine screening usually begins at age 51.  Direct examination of the colon should be repeated every 5-10 years through 45 years of age. However, you may need to be screened more often if early forms of precancerous polyps or small growths are found.  Skin Cancer  Check your skin from head to toe regularly.  Tell your health care provider about any new moles or changes in moles, especially if there is a change in a mole's shape or color.  Also tell your health care provider if you have a mole  that is larger than the size of a pencil eraser.  Always use sunscreen. Apply sunscreen liberally and repeatedly throughout the day.  Protect yourself by wearing long sleeves, pants, a wide-brimmed hat, and sunglasses whenever you are outside.  Heart disease, diabetes, and high blood pressure  High blood pressure causes heart disease and increases the risk of stroke. High blood pressure is more likely to develop in: ? People who have blood pressure in the high end of the normal range (130-139/85-89 mm Hg). ? People who are overweight or obese. ? People who are African American.  If you are 34-33 years of age, have your blood pressure checked every 3-5 years. If you are 65 years of age or older, have your blood pressure checked every year. You should have your blood pressure measured twice-once when you are at a hospital or clinic, and once when you are not at a hospital or clinic. Record the average of the two measurements. To check your blood pressure  when you are not at a hospital or clinic, you can use: ? An automated blood pressure machine at a pharmacy. ? A home blood pressure monitor.  If you are between 46 years and 51 years old, ask your health care provider if you should take aspirin to prevent strokes.  Have regular diabetes screenings. This involves taking a blood sample to check your fasting blood sugar level. ? If you are at a normal weight and have a low risk for diabetes, have this test once every three years after 45 years of age. ? If you are overweight and have a high risk for diabetes, consider being tested at a younger age or more often. Preventing infection Hepatitis B  If you have a higher risk for hepatitis B, you should be screened for this virus. You are considered at high risk for hepatitis B if: ? You were born in a country where hepatitis B is common. Ask your health care provider which countries are considered high risk. ? Your parents were born in a high-risk country, and you have not been immunized against hepatitis B (hepatitis B vaccine). ? You have HIV or AIDS. ? You use needles to inject street drugs. ? You live with someone who has hepatitis B. ? You have had sex with someone who has hepatitis B. ? You get hemodialysis treatment. ? You take certain medicines for conditions, including cancer, organ transplantation, and autoimmune conditions.  Hepatitis C  Blood testing is recommended for: ? Everyone born from [birth year] through 1964. ? Anyone with known risk factors for hepatitis C.  Sexually transmitted infections (STIs)  You should be screened for sexually transmitted infections (STIs) including gonorrhea and chlamydia if: ? You are sexually active and are younger than 45 years of age. ? You are older than 45 years of age and your health care provider tells you that you are at risk for this type of infection. ? Your sexual activity has changed since you were last screened and you are at an increased  risk for chlamydia or gonorrhea. Ask your health care provider if you are at risk.  If you do not have HIV, but are at risk, it may be recommended that you take a prescription medicine daily to prevent HIV infection. This is called pre-exposure prophylaxis (PrEP). You are considered at risk if: ? You are sexually active and do not regularly use condoms or know the HIV status of your partner(s). ? You take drugs by injection. ? You are  sexually active with a partner who has HIV.  Talk with your health care provider about whether you are at high risk of being infected with HIV. If you choose to begin PrEP, you should first be tested for HIV. You should then be tested every 3 months for as long as you are taking PrEP. Pregnancy  If you are premenopausal and you may become pregnant, ask your health care provider about preconception counseling.  If you may become pregnant, take 400 to 800 micrograms (mcg) of folic acid every day.  If you want to prevent pregnancy, talk to your health care provider about birth control (contraception). Osteoporosis and menopause  Osteoporosis is a disease in which the bones lose minerals and strength with aging. This can result in serious bone fractures. Your risk for osteoporosis can be identified using a bone density scan.  If you are 64 years of age or older, or if you are at risk for osteoporosis and fractures, ask your health care provider if you should be screened.  Ask your health care provider whether you should take a calcium or vitamin D supplement to lower your risk for osteoporosis.  Menopause may have certain physical symptoms and risks.  Hormone replacement therapy may reduce some of these symptoms and risks. Talk to your health care provider about whether hormone replacement therapy is right for you. Follow these instructions at home:  Schedule regular health, dental, and eye exams.  Stay current with your immunizations.  Do not use any  tobacco products including cigarettes, chewing tobacco, or electronic cigarettes.  If you are pregnant, do not drink alcohol.  If you are breastfeeding, limit how much and how often you drink alcohol.  Limit alcohol intake to no more than 1 drink per day for nonpregnant women. One drink equals 12 ounces of beer, 5 ounces of wine, or 1 ounces of hard liquor.  Do not use street drugs.  Do not share needles.  Ask your health care provider for help if you need support or information about quitting drugs.  Tell your health care provider if you often feel depressed.  Tell your health care provider if you have ever been abused or do not feel safe at home. This information is not intended to replace advice given to you by your health care provider. Make sure you discuss any questions you have with your health care provider. Document Released: 06/25/2011 Document Revised: 05/17/2016 Document Reviewed: 09/13/2015 Elsevier Interactive Patient Education  [employer].

## 2017-12-13 NOTE — Progress Notes (Signed)
Date of Visit: 12/13/2017   HPI:  Patient presents today for a well woman exam.   Concerns today: vaginal discharge, wants pap smear Periods: periods spacing out, occasional spotting in between periods Contraception: condoms, most of the time. Took preg test earlier this week and it was negative. Pelvic symptoms: no pelvic pain. + vaginal discharge. Has recurrent BV per patient.  Sexual activity: one female partner in the last year STD Screening: desires gc/chlamydia screening today. Declines HIV/RPR, also declines pharyngeal swab for gc/chl. Pap smear status: current, last pap normal 3 years ago including neg HPV but patient desires this be repeated today, she is not comfortable waiting 5 years in between paps Exercise: walks some Diet: working on healthy eating Smoking: smokes 1/2 ppd Alcohol: occasional, monthly or less Drugs: no current use Mood: had a hard time lately due to friend's son's unexpected death, overall coping well though, does not feel depressed  ROS sheet positive for: -trouble seeing - advised to schedule w/ eye doctor. Had episode of infected hordeolum earlier this year. Now just with some age-related blurry vision -ringing in ears, occasional, declines audiology referral -chest pain/palpitations - about 2 times a month has this, usually while lying down or sitting, not worse with exertion. Patient unsure if it is anxiety. Patient does not want EKG or further workup of this today.  ROS: See HPI. Except as noted above ROS neg.   PMFSH:  Cancers in family: none  PHYSICAL EXAM: BP 122/78   Pulse 99   Temp 98.6 F (37 C) (Oral)   Ht 5\' 8"  (1.727 m)   Wt 220 lb (99.8 kg)   SpO2 98%   BMI 33.45 kg/m  Gen: NAD, pleasant, cooperative HEENT: NCAT, PERRL, no palpable thyromegaly or anterior cervical lymphadenopathy Heart: RRR, no murmurs Lungs: CTAB, NWOB Abdomen: soft, nontender to palpation Neuro: grossly nonfocal, speech normal GU: normal appearing external  genitalia without lesions. Vagina is moist with white discharge. Cervix normal in appearance. No cervical motion tenderness or tenderness on bimanual exam. No adnexal masses.   ASSESSMENT/PLAN:  # Health maintenance:  -STD screening: gc/chlamydia done today with pap -pap smear: updated today as patient not wanting to wait 5 years between testing -lipid screening: has had checked in the last few years, patient declined lipid screening today -immunizations: offered flu shot but patient declined -handout given on health maintenance topics  Vaginal discharge - wet prep with clue cells. Sent in rx for flagyl. Advised no alcohol during course of tx.   Intermenstrual spotting noted by history today. Discussed recommendation of endometrial biopsy especially in light of her smoking history. She did not want to discuss in detail today, we planned to schedule her another visit to discuss.  ROS positive as above Plan by ROS issue: - vision issues - sounds age related. No acute vision problems. Visual acuity checked by staff prior to patient leaving and was at least 20/40 with both eyes (full visual acuity not documented unfortunately). Encouraged follow up with optometry or ophthalmology to see if she needs glasses. - chest pain/palpitations - offered EKG to evaluate this further but patient did not want to pursue this today. Discussed chest pain return precautions. She will schedule another visit to discuss in more detail. - ringing in ears - discussed this could be related to hearing loss, offered audiology referral, but patient declined  FOLLOW UP: Follow up in 1 month for chest pain/palpitations/intermenstrual bleeding  [place]renadaBrittany J. Pollie MeyerMcIntyre, MD [facility]

## 2017-12-16 DIAGNOSIS — N923 Ovulation bleeding: Secondary | ICD-10-CM | POA: Insufficient documentation

## 2017-12-16 DIAGNOSIS — N920 Excessive and frequent menstruation with regular cycle: Secondary | ICD-10-CM | POA: Insufficient documentation

## 2017-12-16 NOTE — Assessment & Plan Note (Signed)
noted by history today. Discussed recommendation of endometrial biopsy especially in light of her smoking history. She did not want to discuss in detail today, we planned to schedule her another visit to discuss.

## 2017-12-18 LAB — CYTOLOGY - PAP
CHLAMYDIA, DNA PROBE: NEGATIVE
DIAGNOSIS: NEGATIVE
HPV: NOT DETECTED
NEISSERIA GONORRHEA: NEGATIVE

## 2017-12-24 ENCOUNTER — Encounter: Payer: Self-pay | Admitting: Family Medicine

## 2018-05-27 ENCOUNTER — Telehealth: Payer: Self-pay | Admitting: *Deleted

## 2018-05-27 MED ORDER — IBUPROFEN 800 MG PO TABS: 800.0000 mg | ORAL_TABLET | Freq: Three times a day (TID) | ORAL | 0 refills | Status: DC | PRN

## 2018-05-27 NOTE — Telephone Encounter (Signed)
I have sent in a script for her for her menstrual cramps.  Any refills to be authorized by PCP who is back on Friday.

## 2018-05-27 NOTE — Telephone Encounter (Signed)
Called and informed patient that RX has been sent into her pharmacy.  Alexa Hess, Michelle R, CMA

## 2018-05-27 NOTE — Telephone Encounter (Signed)
I am covering for Dr. Pollie MeyerMcIntyre:   Last script that I can see for 800 mg Ibuprofen was in 2017.  Needs OV to discuss reason for pain.  Can take two 200 mg over the counter ibuprofen for pain until then.

## 2018-05-27 NOTE — Telephone Encounter (Signed)
Rx request for pharmacy for ibuprofen 800 MG. Please advise. Deseree Bruna PotterBlount, CMA

## 2018-05-27 NOTE — Telephone Encounter (Signed)
Called patient to make an appointment to be evaluated for pain.. Patient stated that she takes 800 mg of ibuprofen for menstrual cramps. States that she always has taken prescription medications for this.  She did not want to make an appointment but wanted me to pass this on so that her RX could be refilled.  Glennie Hawk.Beni Turrell R, CMA

## 2018-09-02 ENCOUNTER — Other Ambulatory Visit: Payer: Self-pay

## 2018-09-02 ENCOUNTER — Encounter: Payer: Self-pay | Admitting: Family Medicine

## 2018-09-02 ENCOUNTER — Other Ambulatory Visit (HOSPITAL_COMMUNITY)
Admission: RE | Admit: 2018-09-02 | Discharge: 2018-09-02 | Disposition: A | Discharge status: Home / Self Care | Payer: 59 | Source: Ambulatory Visit | Attending: Family Medicine | Admitting: Family Medicine

## 2018-09-02 ENCOUNTER — Ambulatory Visit: Payer: 59 | Admitting: Family Medicine

## 2018-09-02 VITALS — BP 128/80 | HR 106 | Temp 98.4°F | Ht 68.0 in | Wt 209.0 lb

## 2018-09-02 DIAGNOSIS — R81 Glycosuria: Secondary | ICD-10-CM | POA: Diagnosis not present

## 2018-09-02 DIAGNOSIS — R300 Dysuria: Secondary | ICD-10-CM

## 2018-09-02 DIAGNOSIS — N898 Other specified noninflammatory disorders of vagina: Secondary | ICD-10-CM | POA: Insufficient documentation

## 2018-09-02 LAB — POCT URINALYSIS DIP (MANUAL ENTRY)
Glucose, UA: 250 mg/dL — AB
LEUKOCYTES UA: NEGATIVE
NITRITE UA: NEGATIVE
PH UA: 5.5 (ref 5.0–8.0)
Spec Grav, UA: >=1.03 — AB (ref 1.010–1.025)
UROBILINOGEN UA: 0.2 U/dL

## 2018-09-02 LAB — GLUCOSE, POCT (MANUAL RESULT ENTRY): POC GLUCOSE: 96 mg/dL (ref 70–99)

## 2018-09-02 LAB — POCT WET PREP (WET MOUNT)
CLUE CELLS WET PREP WHIFF POC: NEGATIVE
Trichomonas Wet Prep HPF POC: ABSENT

## 2018-09-02 LAB — POCT UA - MICROSCOPIC ONLY

## 2018-09-02 LAB — POCT GLYCOSYLATED HEMOGLOBIN (HGB A1C): Hemoglobin A1C: 5.1 % (ref 4.0–5.6)

## 2018-09-02 MED ORDER — CEPHALEXIN 500 MG PO CAPS: 500.0000 mg | ORAL_CAPSULE | Freq: Three times a day (TID) | ORAL | 0 refills | Status: DC

## 2018-09-02 NOTE — Patient Instructions (Signed)
Good to see you today!  Thanks for coming in.  We are treating your for at UTI.  Finish all the antibiotics.  You should feel better in 48 hours.  If you do not or if you have worsening fever or any nausea and vomiting or rash or severe stomach pain then come back  If the cultures are positive I will be in touch or send your a letter

## 2018-09-02 NOTE — Progress Notes (Signed)
U

## 2018-09-02 NOTE — Progress Notes (Signed)
Subjective  Alexa Hess is a 47 y.o. female is presenting with the following  DYSURIA ABDOMEN PAIN Several days of urinary frequency and burning associated with nonfocal back and abdomen pain with chills but not noted fever.  Took a few left over doxy pills 2 days ago but stopped.   No nausea and vomiting or diarrhea.  Scant vaginal discharge.  Normal menstrual periods  No hematuria or rash   HO renal stones in the past  Chief Complaint noted Review of Symptoms - see HPI PMH - Smoking status noted.    Objective Vital Signs reviewed BP 128/80   Pulse (!) 106   Temp 98.4 F (36.9 C) (Oral)   Ht 5\' 8"  (1.727 m)   Wt 209 lb (94.8 kg)   SpO2 96%   BMI 31.78 kg/m  Alert NAD Abdomen - mild diffuse bilateral mid abdomen tendereness without guarding or rebound or mass.   GYN - grey moderate discharge.  No sores or lesions, no CMT or adnexal tenderness or masses. Uterus retroverted  No CVAT Skin:  Intact without suspicious lesions or rashes Heart - Regular rate and rhythm.  No murmurs, gallops or rubs.    Lungs:  Normal respiratory effort, chest expands symmetrically. Lungs are clear to auscultation, no crackles or wheezes.   UA and  wet prep noted  Assessments/Plans   See after visit summary for details of patient instuctions  Dysuria Symptoms are most consistent with a partially treated UTI which might explain normal ua.  Patient feels this is like her prior UTI. No signs of stone or PID or intestinal problem.  Will treat and observe for improvement

## 2018-09-02 NOTE — Assessment & Plan Note (Signed)
Symptoms are most consistent with a partially treated UTI which might explain normal ua.  Patient feels this is like her prior UTI. No signs of stone or PID or intestinal problem.  Will treat and observe for improvement

## 2018-09-03 LAB — CERVICOVAGINAL ANCILLARY ONLY
CHLAMYDIA, DNA PROBE: NEGATIVE
Neisseria Gonorrhea: NEGATIVE

## 2018-09-04 ENCOUNTER — Encounter: Payer: Self-pay | Admitting: Family Medicine

## 2018-12-12 ENCOUNTER — Encounter: Payer: Self-pay | Admitting: Family Medicine

## 2018-12-12 ENCOUNTER — Other Ambulatory Visit: Payer: Self-pay

## 2018-12-12 ENCOUNTER — Ambulatory Visit (INDEPENDENT_AMBULATORY_CARE_PROVIDER_SITE_OTHER): Payer: 59 | Admitting: Family Medicine

## 2018-12-12 VITALS — BP 118/80 | HR 89 | Temp 98.6°F | Ht 68.0 in | Wt 216.0 lb

## 2018-12-12 DIAGNOSIS — Z13220 Encounter for screening for lipoid disorders: Secondary | ICD-10-CM | POA: Diagnosis not present

## 2018-12-12 DIAGNOSIS — Z0000 Encounter for general adult medical examination without abnormal findings: Secondary | ICD-10-CM | POA: Diagnosis not present

## 2018-12-12 DIAGNOSIS — M7541 Impingement syndrome of right shoulder: Secondary | ICD-10-CM | POA: Diagnosis not present

## 2018-12-12 DIAGNOSIS — N923 Ovulation bleeding: Secondary | ICD-10-CM | POA: Diagnosis not present

## 2018-12-12 DIAGNOSIS — F17200 Nicotine dependence, unspecified, uncomplicated: Secondary | ICD-10-CM

## 2018-12-12 MED ORDER — IBUPROFEN 800 MG PO TABS: 800.0000 mg | ORAL_TABLET | Freq: Three times a day (TID) | ORAL | 3 refills | Status: DC | PRN

## 2018-12-12 NOTE — Progress Notes (Signed)
Date of Visit: 12/12/2018   HPI:  Patient presents today for a well woman exam.   Concerns today: R shoulder pain, see below Periods: monthly periods. Painful with cramps, takes ibuprofen 800mg  tabs for this. No intermenstrual bleeding. Needs ibuprofen refilled Contraception: condoms Pelvic symptoms: denies having vaginal discharge or pelvic pain Sexual activity: yes STD Screening: declines today Pap smear status: UTD Exercise: walks Smoking: smokes 1/2 ppd, wants to quit Alcohol: occasional Drugs: no Mood: no concerns Dentist: yes has dentist   R shoulder pain - present for about 3 weeks. No preceding injury. Hurts to move her arm all around. Able to raise arm above head but with pain. Ibuprofen helps some  ROS: See HPI  PMFSH:  Cancers in family: none  PHYSICAL EXAM: BP 118/80   Pulse 89   Temp 98.6 F (37 C) (Oral)   Ht 5\' 8"  (1.727 m)   Wt 216 lb (98 kg)   SpO2 97%   BMI 32.84 kg/m  Gen: NAD, pleasant, cooperative HEENT: NCAT, moist mucous membranes,  no palpable thyromegaly or anterior cervical lymphadenopathy Heart: RRR, no murmurs Lungs: CTAB, NWOB Abdomen: soft, nontender to palpation Neuro: grossly nonfocal, speech normal Extremities: full active ROM of R shoulder. +neer. Strength 5/5 in shoulder. No tenderness over biceps tendon  ASSESSMENT/PLAN:  Health maintenance:  -STD screening: declines -pap smear: UTD -mammogram: begin at age 47, no family hx -lipid screening: patient to return for fasting lab visit -immunizations: declines flu shot today -handout given on health maintenance topics  Rotator cuff impingement syndrome Ibuprofen, ice, exercises given If no improvement in a few weeks patient will call & I'll refer her to sports medicine.   Intermenstrual spotting This has now stopped. Continue to assess at future visits.  TOBACCO USER Encouraged cessation  FOLLOW UP: Follow up in 1 year for routine physical  GrenadaBrittany J. Pollie MeyerMcIntyre,  MD [facility]

## 2018-12-12 NOTE — Patient Instructions (Addendum)
On your way out, schedule an appointment one morning to come back for fasting labs. Do not eat or drink anything other than water the morning of your lab appointment until after your labs are drawn.     Health Maintenance, Female Adopting a healthy lifestyle and getting preventive care can go a long way to promote health and wellness. Talk with your health care provider about what schedule of regular examinations is right for you. This is a good chance for you to check in with your provider about disease prevention and staying healthy. In between checkups, there are plenty of things you can do on your own. Experts have done a lot of research about which lifestyle changes and preventive measures are most likely to keep you healthy. Ask your health care provider for more information. Weight and diet Eat a healthy diet  Be sure to include plenty of vegetables, fruits, low-fat dairy products, and lean protein.  Do not eat a lot of foods high in solid fats, added sugars, or salt.  Get regular exercise. This is one of the most important things you can do for your health. ? Most adults should exercise for at least 150 minutes each week. The exercise should increase your heart rate and make you sweat (moderate-intensity exercise). ? Most adults should also do strengthening exercises at least twice a week. This is in addition to the moderate-intensity exercise. Maintain a healthy weight  Body mass index (BMI) is a measurement that can be used to identify possible weight problems. It estimates body fat based on height and weight. Your health care provider can help determine your BMI and help you achieve or maintain a healthy weight.  For females 75 years of age and older: ? A BMI below 18.5 is considered underweight. ? A BMI of 18.5 to 24.9 is normal. ? A BMI of 25 to 29.9 is considered overweight. ? A BMI of 30 and above is considered obese. Watch levels of cholesterol and blood lipids  You should  start having your blood tested for lipids and cholesterol at 46 years of age, then have this test every 5 years.  You may need to have your cholesterol levels checked more often if: ? Your lipid or cholesterol levels are high. ? You are older than 46 years of age. ? You are at high risk for heart disease. Cancer screening Lung Cancer  Lung cancer screening is recommended for adults 75-32 years old who are at high risk for lung cancer because of a history of smoking.  A yearly low-dose CT scan of the lungs is recommended for people who: ? Currently smoke. ? Have quit within the past 15 years. ? Have at least a 30-pack-year history of smoking. A pack year is smoking an average of one pack of cigarettes a day for 1 year.  Yearly screening should continue until it has been 15 years since you quit.  Yearly screening should stop if you develop a health problem that would prevent you from having lung cancer treatment. Breast Cancer  Practice breast self-awareness. This means understanding how your breasts normally appear and feel.  It also means doing regular breast self-exams. Let your health care provider know about any changes, no matter how small.  If you are in your 20s or 30s, you should have a clinical breast exam (CBE) by a health care provider every 1-3 years as part of a regular health exam.  If you are 62 or older, have a CBE every  year. Also consider having a breast X-ray (mammogram) every year.  If you have a family history of breast cancer, talk to your health care provider about genetic screening.  If you are at high risk for breast cancer, talk to your health care provider about having an MRI and a mammogram every year.  Breast cancer gene (BRCA) assessment is recommended for women who have family members with BRCA-related cancers. BRCA-related cancers include: ? Breast. ? Ovarian. ? Tubal. ? Peritoneal cancers.  Results of the assessment will determine the need for  genetic counseling and BRCA1 and BRCA2 testing. Cervical Cancer Your health care provider may recommend that you be screened regularly for cancer of the pelvic organs (ovaries, uterus, and vagina). This screening involves a pelvic examination, including checking for microscopic changes to the surface of your cervix (Pap test). You may be encouraged to have this screening done every 3 years, beginning at age 58.  For women ages 53-65, health care providers may recommend pelvic exams and Pap testing every 3 years, or they may recommend the Pap and pelvic exam, combined with testing for human papilloma virus (HPV), every 5 years. Some types of HPV increase your risk of cervical cancer. Testing for HPV may also be done on women of any age with unclear Pap test results.  Other health care providers may not recommend any screening for nonpregnant women who are considered low risk for pelvic cancer and who do not have symptoms. Ask your health care provider if a screening pelvic exam is right for you.  If you have had past treatment for cervical cancer or a condition that could lead to cancer, you need Pap tests and screening for cancer for at least 20 years after your treatment. If Pap tests have been discontinued, your risk factors (such as having a new sexual partner) need to be reassessed to determine if screening should resume. Some women have medical problems that increase the chance of getting cervical cancer. In these cases, your health care provider may recommend more frequent screening and Pap tests. Colorectal Cancer  This type of cancer can be detected and often prevented.  Routine colorectal cancer screening usually begins at 46 years of age and continues through 46 years of age.  Your health care provider may recommend screening at an earlier age if you have risk factors for colon cancer.  Your health care provider may also recommend using home test kits to check for hidden blood in the  stool.  A small camera at the end of a tube can be used to examine your colon directly (sigmoidoscopy or colonoscopy). This is done to check for the earliest forms of colorectal cancer.  Routine screening usually begins at age 20.  Direct examination of the colon should be repeated every 5-10 years through 46 years of age. However, you may need to be screened more often if early forms of precancerous polyps or small growths are found. Skin Cancer  Check your skin from head to toe regularly.  Tell your health care provider about any new moles or changes in moles, especially if there is a change in a mole's shape or color.  Also tell your health care provider if you have a mole that is larger than the size of a pencil eraser.  Always use sunscreen. Apply sunscreen liberally and repeatedly throughout the day.  Protect yourself by wearing long sleeves, pants, a wide-brimmed hat, and sunglasses whenever you are outside. Heart disease, diabetes, and high blood pressure  High blood pressure causes heart disease and increases the risk of stroke. High blood pressure is more likely to develop in: ? People who have blood pressure in the high end of the normal range (130-139/85-89 mm Hg). ? People who are overweight or obese. ? People who are African American.  If you are 35-49 years of age, have your blood pressure checked every 3-5 years. If you are 11 years of age or older, have your blood pressure checked every year. You should have your blood pressure measured twice-once when you are at a hospital or clinic, and once when you are not at a hospital or clinic. Record the average of the two measurements. To check your blood pressure when you are not at a hospital or clinic, you can use: ? An automated blood pressure machine at a pharmacy. ? A home blood pressure monitor.  If you are between 47 years and 82 years old, ask your health care provider if you should take aspirin to prevent  strokes.  Have regular diabetes screenings. This involves taking a blood sample to check your fasting blood sugar level. ? If you are at a normal weight and have a low risk for diabetes, have this test once every three years after 46 years of age. ? If you are overweight and have a high risk for diabetes, consider being tested at a younger age or more often. Preventing infection Hepatitis B  If you have a higher risk for hepatitis B, you should be screened for this virus. You are considered at high risk for hepatitis B if: ? You were born in a country where hepatitis B is common. Ask your health care provider which countries are considered high risk. ? Your parents were born in a high-risk country, and you have not been immunized against hepatitis B (hepatitis B vaccine). ? You have HIV or AIDS. ? You use needles to inject street drugs. ? You live with someone who has hepatitis B. ? You have had sex with someone who has hepatitis B. ? You get hemodialysis treatment. ? You take certain medicines for conditions, including cancer, organ transplantation, and autoimmune conditions. Hepatitis C  Blood testing is recommended for: ? Everyone born from [birth year] through 1964. ? Anyone with known risk factors for hepatitis C. Sexually transmitted infections (STIs)  You should be screened for sexually transmitted infections (STIs) including gonorrhea and chlamydia if: ? You are sexually active and are younger than 46 years of age. ? You are older than 46 years of age and your health care provider tells you that you are at risk for this type of infection. ? Your sexual activity has changed since you were last screened and you are at an increased risk for chlamydia or gonorrhea. Ask your health care provider if you are at risk.  If you do not have HIV, but are at risk, it may be recommended that you take a prescription medicine daily to prevent HIV infection. This is called pre-exposure prophylaxis  (PrEP). You are considered at risk if: ? You are sexually active and do not regularly use condoms or know the HIV status of your partner(s). ? You take drugs by injection. ? You are sexually active with a partner who has HIV. Talk with your health care provider about whether you are at high risk of being infected with HIV. If you choose to begin PrEP, you should first be tested for HIV. You should then be tested every 3 months for as long  as you are taking PrEP. Pregnancy  If you are premenopausal and you may become pregnant, ask your health care provider about preconception counseling.  If you may become pregnant, take 400 to 800 micrograms (mcg) of folic acid every day.  If you want to prevent pregnancy, talk to your health care provider about birth control (contraception). Osteoporosis and menopause  Osteoporosis is a disease in which the bones lose minerals and strength with aging. This can result in serious bone fractures. Your risk for osteoporosis can be identified using a bone density scan.  If you are 80 years of age or older, or if you are at risk for osteoporosis and fractures, ask your health care provider if you should be screened.  Ask your health care provider whether you should take a calcium or vitamin D supplement to lower your risk for osteoporosis.  Menopause may have certain physical symptoms and risks.  Hormone replacement therapy may reduce some of these symptoms and risks. Talk to your health care provider about whether hormone replacement therapy is right for you. Follow these instructions at home:  Schedule regular health, dental, and eye exams.  Stay current with your immunizations.  Do not use any tobacco products including cigarettes, chewing tobacco, or electronic cigarettes.  If you are pregnant, do not drink alcohol.  If you are breastfeeding, limit how much and how often you drink alcohol.  Limit alcohol intake to no more than 1 drink per day for  nonpregnant women. One drink equals 12 ounces of beer, 5 ounces of wine, or 1 ounces of hard liquor.  Do not use street drugs.  Do not share needles.  Ask your health care provider for help if you need support or information about quitting drugs.  Tell your health care provider if you often feel depressed.  Tell your health care provider if you have ever been abused or do not feel safe at home. This information is not intended to replace advice given to you by your health care provider. Make sure you discuss any questions you have with your health care provider. Document Released: 06/25/2011 Document Revised: 05/17/2016 Document Reviewed: 09/13/2015 Elsevier Interactive Patient Education  2019 [place].    Shoulder Impingement Syndrome Rehab Ask your health care provider which exercises are safe for you. Do exercises exactly as told by your health care provider and adjust them as directed. It is normal to feel mild stretching, pulling, tightness, or discomfort as you do these exercises, but you should stop right away if you feel sudden pain or your pain gets worse.Do not begin these exercises until told by your health care provider. Stretching and range of motion exercise This exercise warms up your muscles and joints and improves the movement and flexibility of your shoulder. This exercise also helps to relieve pain and stiffness. Exercise A: Passive horizontal adduction  1. Sit or stand and pull your left / right elbow across your chest, toward your other shoulder. Stop when you feel a gentle stretch in the back of your shoulder and upper arm. ? Keep your arm at shoulder height. ? Keep your arm as close to your body as you comfortably can. 2. Hold for __________ seconds. 3. Slowly return to the starting position. Repeat __________ times. Complete this exercise __________ times a day. Strengthening exercises These exercises build strength and endurance in your shoulder.  Endurance is the ability to use your muscles for a long time, even after they get tired. Exercise B: External rotation, isometric  1. Stand or sit in a doorway, facing the door frame. 2. Bend your left / right elbow and place the back of your wrist against the door frame. Only your wrist should be touching the frame. Keep your upper arm at your side. 3. Gently press your wrist against the door frame, as if you are trying to push your arm away from your abdomen. ? Avoid shrugging your shoulder while you press your hand against the door frame. Keep your shoulder blade tucked down toward the middle of your back. 4. Hold for __________ seconds. 5. Slowly release the tension, and relax your muscles completely before you do the exercise again. Repeat __________ times. Complete this exercise __________ times a day. Exercise C: Internal rotation, isometric  1. Stand or sit in a doorway, facing the door frame. 2. Bend your left / right elbow and place the inside of your wrist against the door frame. Only your wrist should be touching the frame. Keep your upper arm at your side. 3. Gently press your wrist against the door frame, as if you are trying to push your arm toward your abdomen. ? Avoid shrugging your shoulder while you press your hand against the door frame. Keep your shoulder blade tucked down toward the middle of your back. 4. Hold for __________ seconds. 5. Slowly release the tension, and relax your muscles completely before you do the exercise again. Repeat __________ times. Complete this exercise __________ times a day. Exercise D: Scapular protraction, supine  1. Lie on your back on a firm surface. Hold a __________ weight in your left / right hand. 2. Raise your left / right arm straight into the air so your hand is directly above your shoulder joint. 3. Push the weight into the air so your shoulder lifts off of the surface that you are lying on. Do not move your head, neck, or  back. 4. Hold for __________ seconds. 5. Slowly return to the starting position. Let your muscles relax completely before you repeat this exercise. Repeat __________ times. Complete this exercise __________ times a day. Exercise E: Scapular retraction  1. Sit in a stable chair without armrests, or stand. 2. Secure an exercise band to a stable object in front of you so the band is at shoulder height. 3. Hold one end of the exercise band in each hand. Your palms should face down. 4. Squeeze your shoulder blades together and move your elbows slightly behind you. Do not shrug your shoulders while you do this. 5. Hold for __________ seconds. 6. Slowly return to the starting position. Repeat __________ times. Complete this exercise __________ times a day. Exercise F: Shoulder extension  1. Sit in a stable chair without armrests, or stand. 2. Secure an exercise band to a stable object in front of you where the band is above shoulder height. 3. Hold one end of the exercise band in each hand. 4. Straighten your elbows and lift your hands up to shoulder height. 5. Squeeze your shoulder blades together and pull your hands down to the sides of your thighs. Stop when your hands are straight down by your sides. Do not let your hands go behind your body. 6. Hold for __________ seconds. 7. Slowly return to the starting position. Repeat __________ times. Complete this exercise __________ times a day. This information is not intended to replace advice given to you by your health care provider. Make sure you discuss any questions you have with your health care provider. Document Released: 12/10/2005  Document Revised: 08/16/2016 Document Reviewed: 11/12/2015 Elsevier Interactive Patient Education  [employer].

## 2018-12-13 DIAGNOSIS — M7540 Impingement syndrome of unspecified shoulder: Secondary | ICD-10-CM | POA: Insufficient documentation

## 2018-12-13 NOTE — Assessment & Plan Note (Signed)
Ibuprofen, ice, exercises given If no improvement in a few weeks patient will call & I'll refer her to sports medicine.

## 2018-12-13 NOTE — Assessment & Plan Note (Signed)
Encouraged cessation.

## 2018-12-13 NOTE — Assessment & Plan Note (Signed)
This has now stopped. Continue to assess at future visits.

## 2018-12-15 ENCOUNTER — Telehealth: Payer: Self-pay | Admitting: *Deleted

## 2018-12-15 MED ORDER — NICOTINE POLACRILEX 2 MG MT LOZG: 2.0000 mg | LOZENGE | OROMUCOSAL | 0 refills | Status: DC | PRN

## 2018-12-15 NOTE — Telephone Encounter (Signed)
Pt calls because she thought Dr. Pollie MeyerMcIntyre was going to call in some commit lozenges.  Nothing in chart.  Will forward to Md. Nakeya Adinolfi, Maryjo RochesterJessica Dawn, CMA

## 2018-12-15 NOTE — Telephone Encounter (Signed)
These are over the counter.  I have sent some lozenges in, but not sure they will be paid for.

## 2019-10-06 ENCOUNTER — Ambulatory Visit (INDEPENDENT_AMBULATORY_CARE_PROVIDER_SITE_OTHER): Payer: Self-pay | Admitting: Family Medicine

## 2019-10-06 ENCOUNTER — Encounter: Payer: Self-pay | Admitting: Family Medicine

## 2019-10-06 ENCOUNTER — Other Ambulatory Visit: Payer: Self-pay

## 2019-10-06 VITALS — BP 116/80 | HR 115

## 2019-10-06 DIAGNOSIS — R3989 Other symptoms and signs involving the genitourinary system: Secondary | ICD-10-CM

## 2019-10-06 DIAGNOSIS — N898 Other specified noninflammatory disorders of vagina: Secondary | ICD-10-CM

## 2019-10-06 LAB — POCT WET PREP (WET MOUNT)
Clue Cells Wet Prep Whiff POC: NEGATIVE
Trichomonas Wet Prep HPF POC: ABSENT

## 2019-10-06 LAB — POCT URINALYSIS DIP (MANUAL ENTRY)
Glucose, UA: 100 mg/dL — AB
Leukocytes, UA: NEGATIVE
Nitrite, UA: NEGATIVE
Protein Ur, POC: 30 mg/dL — AB
Spec Grav, UA: 1.025 (ref 1.010–1.025)
Urobilinogen, UA: 1 E.U./dL
pH, UA: 6 (ref 5.0–8.0)

## 2019-10-06 NOTE — Progress Notes (Signed)
   Subjective:    Patient ID: Alexa Hess, female    DOB: [birth year], 48 y.o.   MRN: [id number]   CC: Vaginal itching  HPI: Patient reports she has had vaginal itching and burning for the last 2 weeks.  Denies any discharge that is concerning, specifically denies "cottage cheese" discharge.  Denies any fishy odor.  Patient also reports lower abdominal pain, frequency, urinary urgency, and sometimes hesitation with urination (states sometimes she wants to pee but then there is a delay in the start of urination).  Would like to be tested for GC/chlamydia.  She otherwise denies fevers, malaise, back pain, nausea, vomiting and stool changes.  Smoking status reviewed - patient continues to smoke, states she has cut back  Review of Systems -see HPI   Objective:  BP 116/80   Pulse (!) 115   SpO2 98%  Vitals and nursing note reviewed  General: well nourished, in no acute distress Cardiac: RRR, clear S1 and S2, no murmurs appreciated Respiratory: CTA bilaterally Abdomen: Soft, normal bowel sounds present GU normal: appearing discharge appreciated, no concerning findings, vaginal walls moist with normal-appearing rugae    Assessment & Plan:   Possible urinary tract infection Patient with suprapubic pain, urinary frequency, and urgency, as well as vaginal itch and burn. -UA negative for infection but with +Blood -GC/Chlamydia results pending -Wet prep + for bacteria but - for Trichomonas, Yeast or BV -Will call patient once results return  Vaginal itching Patient with ~2 weeks of vaginal itching, burn -No evidence of vaginal dryness, no erythema or excoriations -Wet prep negative for yeast, trich, BV   Return if symptoms worsen or fail to improve.   Dr. Milus Banister [facility], PGY-2

## 2019-10-07 ENCOUNTER — Other Ambulatory Visit (HOSPITAL_COMMUNITY)
Admission: RE | Admit: 2019-10-07 | Discharge: 2019-10-07 | Disposition: A | Discharge status: Home / Self Care | Payer: 59 | Source: Ambulatory Visit | Attending: Family Medicine | Admitting: Family Medicine

## 2019-10-07 DIAGNOSIS — R3989 Other symptoms and signs involving the genitourinary system: Secondary | ICD-10-CM | POA: Insufficient documentation

## 2019-10-07 DIAGNOSIS — N898 Other specified noninflammatory disorders of vagina: Secondary | ICD-10-CM | POA: Diagnosis not present

## 2019-10-07 NOTE — Assessment & Plan Note (Addendum)
Patient with suprapubic pain, urinary frequency, and urgency, as well as vaginal itch and burn. -UA negative for infection but with +Blood -GC/Chlamydia results pending -Wet prep + for bacteria but - for Trichomonas, Yeast or BV -Will call patient once results return

## 2019-10-08 NOTE — Assessment & Plan Note (Signed)
Patient with ~2 weeks of vaginal itching, burn -No evidence of vaginal dryness, no erythema or excoriations -Wet prep negative for yeast, trich, BV

## 2019-10-12 LAB — CERVICOVAGINAL ANCILLARY ONLY
Chlamydia: NEGATIVE
Comment: NEGATIVE
Comment: NEGATIVE
Comment: NORMAL
Neisseria Gonorrhea: NEGATIVE
Trichomonas: NEGATIVE

## 2020-07-06 ENCOUNTER — Encounter: Payer: Self-pay | Admitting: Family Medicine

## 2020-07-06 ENCOUNTER — Other Ambulatory Visit: Payer: Self-pay

## 2020-07-06 ENCOUNTER — Ambulatory Visit
Admission: RE | Admit: 2020-07-06 | Discharge: 2020-07-06 | Disposition: A | Discharge status: Home / Self Care | Payer: BC Managed Care – PPO | Source: Ambulatory Visit | Attending: Family Medicine | Admitting: Family Medicine

## 2020-07-06 ENCOUNTER — Ambulatory Visit (INDEPENDENT_AMBULATORY_CARE_PROVIDER_SITE_OTHER): Payer: BC Managed Care – PPO | Admitting: Family Medicine

## 2020-07-06 VITALS — BP 114/62 | HR 87 | Wt 233.4 lb

## 2020-07-06 DIAGNOSIS — S83241A Other tear of medial meniscus, current injury, right knee, initial encounter: Secondary | ICD-10-CM | POA: Diagnosis not present

## 2020-07-06 MED ORDER — IBUPROFEN 800 MG PO TABS: 800.0000 mg | ORAL_TABLET | Freq: Three times a day (TID) | ORAL | 1 refills | Status: DC | PRN

## 2020-07-06 NOTE — Patient Instructions (Signed)
I have refilled your ibuprofen and take that as you need. If you start having stomach upset, stop immediately. We are scheduling an MRI of your knee. I wil get in touch with you after that.  If you want to get a compressive knee sleeve, you can try either your local drugstore or  [facility] Address: [address] [place], [place], [place] [address] Hours:  Open ? Closes 5PM Phone: [phone]

## 2020-07-06 NOTE — Assessment & Plan Note (Signed)
This certainly seems like a meniscal tear.  Its been 6 weeks since the initial injury and is not gotten better.  She is tender at the medial joint line.  She has an effusion.  She has difficulty with full range of motion in flexion extension feeling some stiffness probably from the effusion.  I told her I was not concerned for a DVT.  We will get x-rays unless they are particularly revealing which I do not expect, I think she needs an MRI to fully work this up.  I did offer her the alternative pathway of trying corticosteroid injection first and she would prefer to do the full work-up rather than to try treatment will more not exactly sure what is going on.  I refilled her ibuprofen which she is used previously for menstrual cramps and she can use that as needed.  I did caution her about GI upset and bleeding.  I will follow up with her after get the x-rays and then if necessary the MRI.

## 2020-07-06 NOTE — Progress Notes (Signed)
    CHIEF COMPLAINT / HPI: Date of injury: End of April 2021 Patient reports she was using a rowing machine for the first time in April.  She was on a weight loss regimen.  Had not use machine before but was trying to be aggressive with it.  While she was rowing, she felt a pop and had instant pain in her right knee.  It swelled up fairly big the next day and over the next 2 weeks got some better but has not returned to its original size.  She continues to have pain particularly medially and with certain activities.  She is not able to do her regular walking for exercise.  In the last 2 weeks it is actually gotten worse.  She feels like it stiffer, hurts more.  She is also starting to have pain down into her calf area and noticing some swelling in the ankle.  She is concerned that she may have a blood clot. No prior issues with right knee.  No history of knee surgery.  No personal history of diabetes mellitus.   PERTINENT  PMH / PSH: I have reviewed the patient's medications, allergies, past medical and surgical history, smoking status and updated in the EMR as appropriate.   OBJECTIVE:  BP 114/62   Pulse 87   Wt 233 lb 6.4 oz (105.9 kg)   LMP 12/13/2019   SpO2 98%   BMI 35.49 kg/m  GENERAL: Well-developed female no acute distress KNEE: Right.  Small effusion noted.  She is very tender to palpation over the medial anterior meniscus area.  She has full range of motion in flexion extension but does have some sensation of stiffness with both.  Positive Thessaly.  Ligamentously intact to varus and valgus stress.  The calf is soft there is no sign of mass, no clot, no unusual erythema or warmth.  Popliteal space is slightly full but there is no overt Baker's cyst.  The quadricep muscles are normal.  Gait is antalgic. I spent a total of 30 minutes time in examination, face-to-face, setting up her MRI, explaining options. ASSESSMENT / PLAN:   No problem-specific Assessment & Plan notes found for  this encounter.   Denny Levy MD

## 2020-07-11 ENCOUNTER — Encounter: Payer: Self-pay | Admitting: Family Medicine

## 2020-07-11 NOTE — Progress Notes (Signed)
Letter sent.

## 2020-07-29 ENCOUNTER — Ambulatory Visit
Admission: RE | Admit: 2020-07-29 | Discharge: 2020-07-29 | Disposition: A | Discharge status: Home / Self Care | Payer: BC Managed Care – PPO | Source: Ambulatory Visit | Attending: Family Medicine | Admitting: Family Medicine

## 2020-07-29 ENCOUNTER — Other Ambulatory Visit: Payer: Self-pay

## 2020-07-29 DIAGNOSIS — S83241A Other tear of medial meniscus, current injury, right knee, initial encounter: Secondary | ICD-10-CM

## 2020-08-01 ENCOUNTER — Telehealth: Payer: Self-pay

## 2020-08-01 NOTE — Telephone Encounter (Signed)
Discussed results with the pt, mainly the medial meniscus tear. Dr. Jennette Kettle recommends a referral to ortho (either Dr. Luiz Blare or Dr. Murphy/Dr. Everardo Pacific) if pt is agreeable. Pt would like to think about it and will call back with a decision.

## 2020-08-01 NOTE — Progress Notes (Signed)
Pt will be notified by Quenten Raven at [facility] and set up orthopedic follow up

## 2020-09-18 ENCOUNTER — Other Ambulatory Visit: Payer: Self-pay | Admitting: Family Medicine

## 2020-09-19 NOTE — Telephone Encounter (Signed)
Please contact patient to inquire:  - how often is she taking this medication - did she decide to see orthopedics for her knee?  Once I have this info I can decide on the refill   Thanks Latrelle Dodrill, MD

## 2020-09-22 NOTE — Telephone Encounter (Signed)
Patient's mailbox is full and there was no answer.  Will try again later.  Glennie Hawk, CMA

## 2020-09-26 NOTE — Telephone Encounter (Signed)
Attempted to call patient x 3 and the mailbox is full.  No ability to leave message.  Glennie Hawk, CMA

## 2020-09-26 NOTE — Telephone Encounter (Signed)
Talked to patient and she states that she is still deciding on Orthopedics.  Patient states that she is "nervous and really does not want to have surgery" Patient was told that she could take some time to think about it.  Patient takes 800 mg of Ibuprofen on a monthly basics because of her monthly cycle.  She states that the first couple of days is pretty painful.  During those times she takes 2 tabs per day.  One in the morning and one at night to alleviate the discomfort.  Concerning her knee she has not taken any pain killer thus far.  Patient states that she bears the pain and would like to have pain killers only as needed.  Glennie Hawk, CMA

## 2020-09-30 MED ORDER — IBUPROFEN 800 MG PO TABS: 800.0000 mg | ORAL_TABLET | Freq: Three times a day (TID) | ORAL | 0 refills | Status: DC | PRN

## 2020-09-30 NOTE — Telephone Encounter (Signed)
Sent rx in for her Latrelle Dodrill, MD

## 2020-09-30 NOTE — Addendum Note (Signed)
Addended by: Latrelle Dodrill on: 09/30/2020 03:42 PM   Modules accepted: Orders

## 2020-12-09 ENCOUNTER — Encounter: Payer: Self-pay | Admitting: Family Medicine

## 2022-05-29 ENCOUNTER — Encounter: Payer: Self-pay | Admitting: *Deleted

## 2023-05-14 ENCOUNTER — Ambulatory Visit: Payer: 59 | Admitting: Family Medicine

## 2023-05-14 VITALS — BP 137/73 | HR 107 | Ht 68.0 in | Wt 227.1 lb

## 2023-05-14 DIAGNOSIS — Z0100 Encounter for examination of eyes and vision without abnormal findings: Secondary | ICD-10-CM

## 2023-05-14 DIAGNOSIS — R300 Dysuria: Secondary | ICD-10-CM

## 2023-05-14 LAB — POCT UA - MICROSCOPIC ONLY
RBC, Urine, Miroscopic: NONE SEEN (ref 0–2)
WBC, Ur, HPF, POC: NONE SEEN (ref 0–5)

## 2023-05-14 LAB — POCT URINALYSIS DIP (MANUAL ENTRY)
Blood, UA: NEGATIVE
Glucose, UA: 100 mg/dL — AB
Leukocytes, UA: NEGATIVE
Nitrite, UA: NEGATIVE
Protein Ur, POC: 30 mg/dL — AB
Spec Grav, UA: >=1.03 — AB (ref 1.010–1.025)
Urobilinogen, UA: 0.2 E.U./dL
pH, UA: 5.5 (ref 5.0–8.0)

## 2023-05-14 MED ORDER — CEPHALEXIN 500 MG PO CAPS: 500.0000 mg | ORAL_CAPSULE | Freq: Two times a day (BID) | ORAL | 0 refills | Status: AC

## 2023-05-14 NOTE — Patient Instructions (Addendum)
It was great seeing you today!  Today we discussed your symptoms, your urine did not show an infection but given your symptoms I am concerned about a possible urinary tract infection. So I will treat you with keflex 500 mg twice daily for 7 days. Please make sure to complete the entire antibiotic course even if you start to feel better sooner. We are also waiting on a urine culture, as we discussed if we need to change antibiotics then I will call you to discuss this.   If you do not feel better after completing the keflex then please return to the clinic for another evaluation.   Please make sure to follow up with Dr. Pollie Meyer for a physical and to review all your medical history since it has been a few years since you were last seen here.   Please follow up at your next scheduled appointment, if anything arises between now and then, please don't hesitate to contact our office.   Thank you for allowing [place] to be a part of your medical care!  Thank you, Dr. Robyne Peers

## 2023-05-14 NOTE — Assessment & Plan Note (Signed)
-  concerning for UTI, UA notable for ketones and glucose although this seems to have been stable over the last few years. Would recommend screening for DM at future visit. Given clinical symptoms, will treat for potential UTI. Keflex 500 mg bid for 7 days prescribed. Low concern for PID given history and unremarkable abdominal exam. Also considered pyelonephritis although low suspicion with negative CVA tenderness and no other systemic signs or symptoms.  -pending urine culture -encouraged adequate hydration  -strict return precautions discussed

## 2023-05-14 NOTE — Progress Notes (Signed)
    SUBJECTIVE:   CHIEF COMPLAINT / HPI:   Patient presents with dysuria and abdominal pain that started 3-4 months ago. Has been occurring continuously. Feels like it may have got worse which is why she is here toady. Urine appears a dark yellow. Stays hydrated. Denies vaginal discharge, fever, pelvic pain or other symptoms. History of renal stones and urinary tract infections.   Of note, patient cannot remember the last time she saw an eye doctor, says that it has been years. She feels that this is due to her vision getting worse.   OBJECTIVE:   BP 137/73   Pulse (!) 107   Ht 5\' 8"  (1.727 m)   Wt 227 lb 2 oz (103 kg)   SpO2 99%   BMI 34.53 kg/m   General: Patient well-appearing and well-groomed, in no acute distress.  CV: RRR, no murmurs or gallops auscultated Resp: CTAB, no wheezing, rales or rhonchi  Abdomen: soft, nontender, nondistended, presence of bowel sounds MSK: no CVA tenderness noted bilaterally, capillary refill less than 2 sec, no LE edema noted bilaterally  ASSESSMENT/PLAN:   Dysuria -concerning for UTI, UA notable for ketones and glucose although this seems to have been stable over the last few years. Would recommend screening for DM at future visit. Given clinical symptoms, will treat for potential UTI. Keflex 500 mg bid for 7 days prescribed. Low concern for PID given history and unremarkable abdominal exam. Also considered pyelonephritis although low suspicion with negative CVA tenderness and no other systemic signs or symptoms.  -pending urine culture -encouraged adequate hydration  -strict return precautions discussed   Health maintenance -Need for vision screening, ophthalmology referral placed -Advised to follow up with PCP at her earliest convenience for physical, to review medical history, PAP smear, mammogram and colonoscopy.    Reece Leader, DO [facility]

## 2023-05-15 ENCOUNTER — Other Ambulatory Visit: Payer: Self-pay | Admitting: Family Medicine

## 2023-05-15 DIAGNOSIS — R300 Dysuria: Secondary | ICD-10-CM

## 2023-05-15 DIAGNOSIS — R1084 Generalized abdominal pain: Secondary | ICD-10-CM

## 2023-05-16 ENCOUNTER — Telehealth: Payer: Self-pay

## 2023-05-16 NOTE — Telephone Encounter (Signed)
Attempted to reach patient. To see what time and date would be best for me to schedule her renal ultrasound. LVM for patient to call the office with that information. Aquilla Solian, CMA

## 2023-05-16 NOTE — Telephone Encounter (Signed)
-----   Message from Reece Leader, DO sent at 05/15/2023 11:51 AM EDT ----- Hi,  Just wanted to let you know that I have ordered a renal stone ultrasound, just wanted to let you know. I told her you may be in contact to schedule this.  Thanks, Anupa

## 2023-05-17 ENCOUNTER — Other Ambulatory Visit: Payer: 59

## 2023-05-17 ENCOUNTER — Telehealth: Payer: Self-pay

## 2023-05-17 DIAGNOSIS — R300 Dysuria: Secondary | ICD-10-CM

## 2023-05-17 LAB — URINE CULTURE

## 2023-05-17 NOTE — Telephone Encounter (Signed)
Patient has appointment already for 6/24. Alexa Hess, CMA

## 2023-05-17 NOTE — Telephone Encounter (Signed)
Patient has appointment made already for 6/24 at 12:40 [facility] location. Patient is aware of appointment as well  Aquilla Solian, CMA

## 2023-05-17 NOTE — Telephone Encounter (Signed)
-----   Message from Anupa Ganta, DO sent at 05/15/2023 11:51 AM EDT ----- Hi,  Just wanted to let you know that I have ordered a renal stone ultrasound, just wanted to let you know. I told her you may be in contact to schedule this.  Thanks, Anupa   

## 2023-05-18 LAB — COMPREHENSIVE METABOLIC PANEL
ALT: 9 IU/L (ref 0–32)
AST: 11 IU/L (ref 0–40)
Albumin/Globulin Ratio: 1.5 (ref 1.2–2.2)
Albumin: 4.4 g/dL (ref 3.9–4.9)
Alkaline Phosphatase: 102 IU/L (ref 44–121)
BUN/Creatinine Ratio: 9 (ref 9–23)
BUN: 11 mg/dL (ref 6–24)
Bilirubin Total: 0.4 mg/dL (ref 0.0–1.2)
CO2: 24 mmol/L (ref 20–29)
Calcium: 10 mg/dL (ref 8.7–10.2)
Chloride: 103 mmol/L (ref 96–106)
Creatinine, Ser: 1.17 mg/dL — ABNORMAL HIGH (ref 0.57–1.00)
Globulin, Total: 3 g/dL (ref 1.5–4.5)
Glucose: 71 mg/dL (ref 70–99)
Potassium: 4.4 mmol/L (ref 3.5–5.2)
Sodium: 140 mmol/L (ref 134–144)
Total Protein: 7.4 g/dL (ref 6.0–8.5)
eGFR: 57 mL/min/{1.73_m2} — ABNORMAL LOW (ref >59)

## 2023-05-30 ENCOUNTER — Ambulatory Visit: Payer: Self-pay | Admitting: Family Medicine

## 2023-06-17 ENCOUNTER — Ambulatory Visit
Admission: RE | Admit: 2023-06-17 | Discharge: 2023-06-17 | Disposition: A | Discharge status: Home / Self Care | Payer: 59 | Source: Ambulatory Visit | Attending: Family Medicine | Admitting: Family Medicine

## 2023-06-17 DIAGNOSIS — R1084 Generalized abdominal pain: Secondary | ICD-10-CM

## 2023-06-17 DIAGNOSIS — R300 Dysuria: Secondary | ICD-10-CM

## 2023-07-17 ENCOUNTER — Telehealth: Payer: Self-pay

## 2023-07-17 NOTE — Telephone Encounter (Signed)
Patient calls nurse line requesting results from [place] back in June.   It appears the provider sent a mychart message to patient with normal results.   I apologized and advised of this. Patient reports she does not have mychart access at this time.   Normal results discussed.

## 2023-08-23 ENCOUNTER — Encounter: Payer: Self-pay | Admitting: Family Medicine

## 2024-02-28 ENCOUNTER — Ambulatory Visit (INDEPENDENT_AMBULATORY_CARE_PROVIDER_SITE_OTHER): Payer: 59 | Admitting: Family Medicine

## 2024-02-28 ENCOUNTER — Other Ambulatory Visit (HOSPITAL_COMMUNITY)
Admission: RE | Admit: 2024-02-28 | Discharge: 2024-02-28 | Disposition: A | Discharge status: Home / Self Care | Source: Ambulatory Visit | Attending: Family Medicine | Admitting: Family Medicine

## 2024-02-28 VITALS — BP 139/76 | HR 74 | Ht 68.0 in | Wt 232.0 lb

## 2024-02-28 DIAGNOSIS — F17200 Nicotine dependence, unspecified, uncomplicated: Secondary | ICD-10-CM

## 2024-02-28 DIAGNOSIS — Z124 Encounter for screening for malignant neoplasm of cervix: Secondary | ICD-10-CM

## 2024-02-28 DIAGNOSIS — Z0000 Encounter for general adult medical examination without abnormal findings: Secondary | ICD-10-CM | POA: Diagnosis not present

## 2024-02-28 DIAGNOSIS — D229 Melanocytic nevi, unspecified: Secondary | ICD-10-CM

## 2024-02-28 DIAGNOSIS — Z711 Person with feared health complaint in whom no diagnosis is made: Secondary | ICD-10-CM | POA: Diagnosis not present

## 2024-02-28 DIAGNOSIS — Z1211 Encounter for screening for malignant neoplasm of colon: Secondary | ICD-10-CM

## 2024-02-28 DIAGNOSIS — Z1231 Encounter for screening mammogram for malignant neoplasm of breast: Secondary | ICD-10-CM

## 2024-02-28 LAB — POCT UA - MICROSCOPIC ONLY: WBC, Ur, HPF, POC: NONE SEEN (ref 0–5)

## 2024-02-28 LAB — POCT URINALYSIS DIP (MANUAL ENTRY)
Bilirubin, UA: NEGATIVE
Glucose, UA: NEGATIVE mg/dL
Ketones, POC UA: NEGATIVE mg/dL
Leukocytes, UA: NEGATIVE
Nitrite, UA: NEGATIVE
Protein Ur, POC: NEGATIVE mg/dL
Spec Grav, UA: 1.015 (ref 1.010–1.025)
Urobilinogen, UA: 0.2 U/dL
pH, UA: 6 (ref 5.0–8.0)

## 2024-02-28 MED ORDER — NICOTINE POLACRILEX 4 MG MT LOZG: 4.0000 mg | LOZENGE | OROMUCOSAL | 0 refills | Status: AC | PRN

## 2024-02-28 NOTE — Progress Notes (Signed)
   Date of Visit: 02/28/2024   SUBJECTIVE:   HPI:  Alexa Hess presents today for a well woman exam.   Concerns today: Desires refill of nicotine lozenges Periods: Postmenopausal for over 1 year Contraception: Postmenopausal Pelvic symptoms: Denies pelvic pain or discharge Sexual activity: Not in over 3 years STD Screening: Desires GC/chlamydia testing today with Pap Pap smear status: Due, amenable to getting this today Exercise: Walks 30 minutes a day Smoking: Currently smokes less than half a pack per day, desires to quit Alcohol: Occasional, not every day Drugs: None Mood: No concerns Cancers in family: None  Noted a mole on her arm that is somewhat irritated, showed up about a year ago.  She scratches at it and thinks this makes it larger.  OBJECTIVE:   BP 139/76   Pulse 74   Ht 5\' 8"  (1.727 m)   Wt 232 lb (105.2 kg)   SpO2 98%   BMI 35.28 kg/m  Gen: NAD, pleasant, cooperative HEENT: NCAT, PERRL, no palpable thyromegaly or anterior cervical lymphadenopathy Heart: RRR, no murmurs Lungs: CTAB, NWOB Abdomen: soft, nontender to palpation Neuro: grossly nonfocal, speech normal GU: normal appearing external genitalia without lesions. Vagina is moist with white discharge. Cervix normal in appearance. No cervical motion tenderness or tenderness on bimanual exam. No adnexal masses. Skin: Hyperpigmented palpable lesion on right upper arm, approximately 5 mm in diameter, no irregularity of shape or colors  ASSESSMENT/PLAN:   Assessment & Plan Routine adult health maintenance -STD screening: done today -pap smear: updated today -mammogram: ordered, advised how to schedule -lipid screening: declines labs -colon cancer screening: reviewed options, patient selected cologuard, ordered -immunizations:  Flu: declines Tdap: UTD Shingrix: declines COVID: declines Pneumovax: declines -handout given on health maintenance topics Concern about urinary tract disease without  diagnosis Checking UA today at patient's request No current dysuria Tobacco use disorder Rx for lozenges sent in  Melanocytic nevus, unspecified location Advised we can remove this and send for path, encouraged to schedule appointment to have this done  FOLLOW UP: Follow up in 1 year for next CPE, sooner for mole removal  [place] J. Pollie Meyer, MD [facility]

## 2024-02-28 NOTE — Patient Instructions (Addendum)
 It was great to see you again today.  Pap today Cologuard will come to your house  To schedule your mammogram, call the [facility] at [phone]. They are located at [address].   Sent in lozenges Can schedule appointment for removal of the spot on your arm if you want Follow up in 1 year for next physical, sooner if needed    Be well, Dr. Pollie Meyer

## 2024-03-01 NOTE — Assessment & Plan Note (Signed)
-  STD screening: done today -pap smear: updated today -mammogram: ordered, advised how to schedule -lipid screening: declines labs -colon cancer screening: reviewed options, patient selected cologuard, ordered -immunizations:  Flu: declines Tdap: UTD Shingrix: declines COVID: declines Pneumovax: declines -handout given on health maintenance topics

## 2024-03-01 NOTE — Assessment & Plan Note (Signed)
 Rx for lozenges sent in

## 2024-03-03 LAB — CYTOLOGY - PAP
Chlamydia: NEGATIVE
Comment: NEGATIVE
Comment: NEGATIVE
Comment: NEGATIVE
Comment: NORMAL
Diagnosis: NEGATIVE
High risk HPV: NEGATIVE
Neisseria Gonorrhea: NEGATIVE
Trichomonas: NEGATIVE

## 2024-03-05 ENCOUNTER — Encounter: Payer: Self-pay | Admitting: Family Medicine

## 2024-03-12 LAB — COLOGUARD: COLOGUARD: NEGATIVE

## 2024-03-13 ENCOUNTER — Encounter: Payer: Self-pay | Admitting: Family Medicine

## 2024-05-26 ENCOUNTER — Encounter: Payer: Self-pay | Admitting: Student

## 2024-05-26 ENCOUNTER — Ambulatory Visit: Admitting: Student

## 2024-05-26 VITALS — BP 148/80 | HR 84 | Temp 99.0°F | Wt 227.0 lb

## 2024-05-26 DIAGNOSIS — J069 Acute upper respiratory infection, unspecified: Secondary | ICD-10-CM

## 2024-05-26 MED ORDER — GUAIFENESIN ER 600 MG PO TB12: 600.0000 mg | ORAL_TABLET | Freq: Two times a day (BID) | ORAL | 0 refills | Status: AC

## 2024-05-26 NOTE — Progress Notes (Signed)
    SUBJECTIVE:   CHIEF COMPLAINT / HPI:   Alexa Hess is a 53 year old female who presents with upper respiratory symptoms and body aches.  She has been experiencing symptoms since last Wednesday, starting with a throat irritation. Over the weekend, she developed chills, fever, headache, and jaw pain when opening her mouth. She also has a runny nose, sneezing, and a cough with mucus production. Reports diffused muscle aches. She has been using Advil , ginger, cinnamon, and tea for relief, but symptoms persist. She has a history of bronchitis but no recent episodes. No ear pain is reported, but her jaw and head are throbbing.  PERTINENT  PMH / PSH: Reviewed   OBJECTIVE:   BP (!) 148/80   Pulse 84   Temp 99 F (37.2 C) (Oral)   Wt 227 lb (103 kg)   SpO2 97%   BMI 34.52 kg/m    Physical Exam General: Alert, well appearing, NAD HEENT: No oropharyngeal erythema, cervical lymphadenopathy or tonsillar exudate. Cardiovascular: RRR, No Murmurs, Normal S2/S2 Respiratory: CTAB, No wheezing or Rales Abdomen: No distension or tenderness  ASSESSMENT/PLAN:   Viral Upper Respiratory Infection Symptoms consistent with viral infection. No bacterial signs. Antibiotics not indicated. Emphasized symptom management. - Order Mucinex for chest congestion. - Advise warm water with honey and lemon for throat discomfort. - Provide nasal saline spray sample and instruct on use. - Recommend Tylenol with Advil  for myalgia and fever every six hours for two days. - Advise return to work after being afebrile for 24 hours.  Goble Last, MD [facility]

## 2024-05-26 NOTE — Patient Instructions (Signed)
 Pleasure to meet you today.  Suspect your symptoms are most likely due to ongoing viral upper respiratory illness and gastroenteritis.  I recommend making sure you stay hydrated with enough water.  Also you can do Tylenol or ibuprofen  for any aches and pain with the cough.  Encourage use of warm water, honey and lemon to help with the cough.  Have sent in prescription for Mucinex which you should take twice daily for at least 5-7 days.  Use the nasal saline provided to you today to irrigate your nose to help with the runny nose.

## 2024-06-04 ENCOUNTER — Telehealth: Payer: Self-pay

## 2024-06-04 NOTE — Telephone Encounter (Signed)
 Patient calls nurse line regarding follow up from visit last week with Dr. Mikeal Alder.   She reports that she was told that if her symptoms were not improved by this week that provider asked that she call into office for prescription of abx.   She reports that this is day 16 of her having symptoms.  Denies fever today. She is worried that infection has settled into her chest. She states that cough will alternate between being dry and being productive. Sputum is now darker yellow/green.   Will forward to Dr. Mikeal Alder for further advisement.   Elsie Halo, RN

## 2024-06-08 ENCOUNTER — Other Ambulatory Visit: Payer: Self-pay | Admitting: Student

## 2024-06-08 DIAGNOSIS — J018 Other acute sinusitis: Secondary | ICD-10-CM

## 2024-06-08 MED ORDER — AZITHROMYCIN 250 MG PO TABS: ORAL_TABLET | ORAL | 0 refills | Status: DC

## 2024-06-08 NOTE — Telephone Encounter (Signed)
 Patient returns call to nurse line and LVM.   She reports she was advised to make a FU apt for an antibiotic.  She reports she is not coming back in for another apt. She reports she discussed this with the provider she saw on 6/3. She reports she was told she would receive an antibiotic if her symptoms did not improve.   She reports a continued persistent cough.   She reports, if Dr. Mikeal Alder is unwilling to give me an antibiotic, please send to [facility].  Advised will forward to provider who saw patient.

## 2024-06-08 NOTE — Progress Notes (Signed)
Rx Z-pack for sinusitis

## 2024-06-08 NOTE — Telephone Encounter (Signed)
 Called patient. She did not answer, LVM asking that patient return call to office to schedule follow up visit.   Elsie Halo, RN

## 2024-06-11 ENCOUNTER — Telehealth: Payer: Self-pay

## 2024-06-11 DIAGNOSIS — J018 Other acute sinusitis: Secondary | ICD-10-CM

## 2024-06-11 MED ORDER — AZITHROMYCIN 250 MG PO TABS: ORAL_TABLET | ORAL | 0 refills | Status: AC

## 2024-06-11 NOTE — Telephone Encounter (Signed)
 Patient LVM on nurse line voicing concerns with not being able to pick up azithromycin .   She states that [facility] told her that there was an issue with the prescription.   Called [facility]. Pharmacist states that directions do not match the dispense quantity. 6 tablets would be the quantity to match the directions.   Please update prescription.   Elsie Halo, RN

## 2024-06-11 NOTE — Telephone Encounter (Signed)
New rx sent Alexa Rio, MD

## 2024-08-10 ENCOUNTER — Telehealth: Payer: Self-pay

## 2024-08-10 ENCOUNTER — Ambulatory Visit: Admitting: Family Medicine

## 2024-08-10 VITALS — BP 135/87 | HR 111 | Ht 68.0 in | Wt 229.8 lb

## 2024-08-10 DIAGNOSIS — R21 Rash and other nonspecific skin eruption: Secondary | ICD-10-CM | POA: Diagnosis not present

## 2024-08-10 MED ORDER — PREDNISONE 50 MG PO TABS: 50.0000 mg | ORAL_TABLET | Freq: Every day | ORAL | 0 refills | Status: AC

## 2024-08-10 NOTE — Progress Notes (Signed)
    SUBJECTIVE:   CHIEF COMPLAINT / HPI:   Facial rash Noticed onset of facial rash approximately 1 week ago after utilizing lip scrub that was new.  Notes that she had initial area of irritation on her left chin that she put alcohol on which caused significant irritation.  She also had small bumps in the center of her chin.  In the last few days the rash is now ascended up her left cheek towards her left ear.  States it is itchy and painful.  Denies mucosal lesions.  Denies vision changes.  Denies hearing changes.  PERTINENT  PMH / PSH: GERD, tobacco use, HLD  OBJECTIVE:   BP 135/87   Pulse (!) 111   Ht 5' 8 (1.727 m)   Wt 229 lb 12.8 oz (104.2 kg)   SpO2 97%   BMI 34.94 kg/m    General: NAD, pleasant, able to participate in exam Rash: 3 erythematous pustules with some crusting present on chin.  Quarter size area of scabbing with dried, erythematous skin on left lower chin.  Faint erythematous, papular rash across left cheekbone extending up towards left ear.      ASSESSMENT/PLAN:   Assessment & Plan Rash Most consistent with irritant dermatitis given onset after new facial product, likely continued irritation from application of make-up and other facial products.  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  Out of the window for shingles treatment, will manage with steroids symptomatically. -Will start prednisone  50 mg x 5 days -Return if not improved in 1 to 2 weeks   Dr. Izetta Nap, DO [facility]

## 2024-08-10 NOTE — Patient Instructions (Signed)
 It was wonderful to see you today! Thank you for choosing [facility].   Please bring ALL of your medications with you to every visit.   Today we talked about:  Please take the steroids for the next 5 days to help with your symptoms.  It should help with both the pain and the inflammation.  I think this is most likely a contact irritation from the products that you used on your face and less likely shingles given the appearance.  You are out of the window for shingles treatment anyway which has to be initiated within 3 days of onset.  The steroids regardless will help.  If you have persistence of symptoms after 1 to 2 weeks please return to care for evaluation.  Please follow up as needed for persistent symptoms  Call the clinic at [phone] if your symptoms worsen or you have any concerns.  Please be sure to schedule follow up at the front desk before you leave today.   Izetta Nap, DO Family Medicine

## 2024-08-10 NOTE — Telephone Encounter (Signed)
 Patient calls nurse line reporting facial swelling.   She reports she used a new face scrub last Monday night and reports on Tuesday morning she noticed swelling and tingling on her chin. She reports as the days went on the symptoms worsened. She reports now the whole left side of her face is swollen and tingling. She reports a discomfort of 7/10.  She reports some itchy bumps in places as well.   She reports chills and reports a temp of 99. She denies any difficulty breathing  or tongue swelling.   Patient scheduled for this afternoon for evaluation.
